# Patient Record
Sex: Female | Born: 1945
Health system: Southern US, Community
[De-identification: ages and names within clinical notes are randomized; demographics above are authoritative.]

## PROBLEM LIST (undated history)

## (undated) DIAGNOSIS — C50919 Malignant neoplasm of unspecified site of unspecified female breast: Secondary | ICD-10-CM

## (undated) DIAGNOSIS — T7840XA Allergy, unspecified, initial encounter: Secondary | ICD-10-CM

## (undated) DIAGNOSIS — Z923 Personal history of irradiation: Secondary | ICD-10-CM

## (undated) HISTORY — PX: BREAST CYST ASPIRATION: SHX578

## (undated) HISTORY — DX: Allergy, unspecified, initial encounter: T78.40XA

---

## 2001-09-12 ENCOUNTER — Other Ambulatory Visit: Admission: RE | Admit: 2001-09-12 | Discharge: 2001-09-12 | Payer: Self-pay | Admitting: Gynecology

## 2002-08-28 ENCOUNTER — Encounter: Payer: Self-pay | Admitting: Family Medicine

## 2002-08-28 ENCOUNTER — Ambulatory Visit (HOSPITAL_COMMUNITY): Admission: RE | Admit: 2002-08-28 | Discharge: 2002-08-28 | Payer: Self-pay | Admitting: Family Medicine

## 2002-10-29 ENCOUNTER — Other Ambulatory Visit: Admission: RE | Admit: 2002-10-29 | Discharge: 2002-10-29 | Payer: Self-pay | Admitting: Gynecology

## 2003-01-22 ENCOUNTER — Encounter: Admission: RE | Admit: 2003-01-22 | Discharge: 2003-01-22 | Payer: Self-pay | Admitting: Family Medicine

## 2003-01-22 ENCOUNTER — Encounter: Payer: Self-pay | Admitting: Family Medicine

## 2003-02-04 ENCOUNTER — Encounter: Admission: RE | Admit: 2003-02-04 | Discharge: 2003-02-04 | Payer: Self-pay | Admitting: Family Medicine

## 2003-02-04 ENCOUNTER — Encounter: Payer: Self-pay | Admitting: Family Medicine

## 2003-11-02 ENCOUNTER — Other Ambulatory Visit: Admission: RE | Admit: 2003-11-02 | Discharge: 2003-11-02 | Payer: Self-pay | Admitting: Gynecology

## 2003-12-17 ENCOUNTER — Ambulatory Visit (HOSPITAL_COMMUNITY): Admission: RE | Admit: 2003-12-17 | Discharge: 2003-12-17 | Payer: Self-pay | Admitting: Family Medicine

## 2004-01-21 ENCOUNTER — Encounter: Admission: RE | Admit: 2004-01-21 | Discharge: 2004-01-21 | Payer: Self-pay | Admitting: Family Medicine

## 2004-11-07 ENCOUNTER — Other Ambulatory Visit: Admission: RE | Admit: 2004-11-07 | Discharge: 2004-11-07 | Payer: Self-pay | Admitting: Gynecology

## 2005-01-26 ENCOUNTER — Encounter: Admission: RE | Admit: 2005-01-26 | Discharge: 2005-01-26 | Payer: Self-pay | Admitting: Family Medicine

## 2005-11-13 ENCOUNTER — Other Ambulatory Visit: Admission: RE | Admit: 2005-11-13 | Discharge: 2005-11-13 | Payer: Self-pay | Admitting: Gynecology

## 2006-01-24 ENCOUNTER — Encounter: Admission: RE | Admit: 2006-01-24 | Discharge: 2006-01-24 | Payer: Self-pay | Admitting: Family Medicine

## 2006-12-05 ENCOUNTER — Other Ambulatory Visit: Admission: RE | Admit: 2006-12-05 | Discharge: 2006-12-05 | Payer: Self-pay | Admitting: Gynecology

## 2007-01-23 ENCOUNTER — Encounter: Admission: RE | Admit: 2007-01-23 | Discharge: 2007-01-23 | Payer: Self-pay | Admitting: Family Medicine

## 2008-01-23 ENCOUNTER — Encounter: Admission: RE | Admit: 2008-01-23 | Discharge: 2008-01-23 | Payer: Self-pay | Admitting: Family Medicine

## 2008-08-25 ENCOUNTER — Encounter: Admission: RE | Admit: 2008-08-25 | Discharge: 2008-08-25 | Payer: Self-pay | Admitting: Family Medicine

## 2009-01-21 ENCOUNTER — Encounter: Admission: RE | Admit: 2009-01-21 | Discharge: 2009-01-21 | Payer: Self-pay | Admitting: Family Medicine

## 2010-01-20 ENCOUNTER — Encounter: Admission: RE | Admit: 2010-01-20 | Discharge: 2010-01-20 | Payer: Self-pay | Admitting: Family Medicine

## 2011-01-03 ENCOUNTER — Other Ambulatory Visit: Payer: Self-pay | Admitting: Family Medicine

## 2011-01-03 DIAGNOSIS — Z1231 Encounter for screening mammogram for malignant neoplasm of breast: Secondary | ICD-10-CM

## 2011-01-12 ENCOUNTER — Ambulatory Visit: Payer: Self-pay

## 2011-02-13 ENCOUNTER — Ambulatory Visit
Admission: RE | Admit: 2011-02-13 | Discharge: 2011-02-13 | Disposition: A | Discharge status: Home / Self Care | Payer: BC Managed Care – PPO | Source: Ambulatory Visit | Attending: Family Medicine | Admitting: Family Medicine

## 2011-02-13 DIAGNOSIS — Z1231 Encounter for screening mammogram for malignant neoplasm of breast: Secondary | ICD-10-CM

## 2011-12-18 ENCOUNTER — Other Ambulatory Visit: Payer: Self-pay | Admitting: Family Medicine

## 2011-12-18 DIAGNOSIS — Z1231 Encounter for screening mammogram for malignant neoplasm of breast: Secondary | ICD-10-CM

## 2012-01-11 ENCOUNTER — Ambulatory Visit
Admission: RE | Admit: 2012-01-11 | Discharge: 2012-01-11 | Disposition: A | Discharge status: Home / Self Care | Payer: BC Managed Care – PPO | Source: Ambulatory Visit | Attending: Family Medicine | Admitting: Family Medicine

## 2012-01-11 DIAGNOSIS — Z1231 Encounter for screening mammogram for malignant neoplasm of breast: Secondary | ICD-10-CM

## 2012-12-25 ENCOUNTER — Other Ambulatory Visit: Payer: Self-pay | Admitting: Family Medicine

## 2012-12-25 DIAGNOSIS — Z1231 Encounter for screening mammogram for malignant neoplasm of breast: Secondary | ICD-10-CM

## 2013-01-07 ENCOUNTER — Ambulatory Visit
Admission: RE | Admit: 2013-01-07 | Discharge: 2013-01-07 | Disposition: A | Discharge status: Home / Self Care | Payer: BC Managed Care – PPO | Source: Ambulatory Visit | Attending: Family Medicine | Admitting: Family Medicine

## 2013-01-07 DIAGNOSIS — Z1231 Encounter for screening mammogram for malignant neoplasm of breast: Secondary | ICD-10-CM

## 2013-12-18 ENCOUNTER — Other Ambulatory Visit: Payer: Self-pay

## 2013-12-18 DIAGNOSIS — Z1231 Encounter for screening mammogram for malignant neoplasm of breast: Secondary | ICD-10-CM

## 2014-01-08 ENCOUNTER — Ambulatory Visit: Payer: BC Managed Care – PPO

## 2014-01-08 ENCOUNTER — Ambulatory Visit
Admission: RE | Admit: 2014-01-08 | Discharge: 2014-01-08 | Disposition: A | Discharge status: Home / Self Care | Payer: BC Managed Care – PPO | Source: Ambulatory Visit

## 2014-01-08 DIAGNOSIS — Z1231 Encounter for screening mammogram for malignant neoplasm of breast: Secondary | ICD-10-CM

## 2015-01-01 ENCOUNTER — Other Ambulatory Visit: Payer: Self-pay

## 2015-01-01 DIAGNOSIS — Z1231 Encounter for screening mammogram for malignant neoplasm of breast: Secondary | ICD-10-CM

## 2015-01-28 ENCOUNTER — Ambulatory Visit
Admission: RE | Admit: 2015-01-28 | Discharge: 2015-01-28 | Disposition: A | Discharge status: Home / Self Care | Payer: Medicare HMO | Source: Ambulatory Visit

## 2015-01-28 DIAGNOSIS — Z1231 Encounter for screening mammogram for malignant neoplasm of breast: Secondary | ICD-10-CM

## 2015-12-28 ENCOUNTER — Other Ambulatory Visit: Payer: Self-pay

## 2015-12-28 DIAGNOSIS — Z1231 Encounter for screening mammogram for malignant neoplasm of breast: Secondary | ICD-10-CM

## 2016-01-31 ENCOUNTER — Ambulatory Visit
Admission: RE | Admit: 2016-01-31 | Discharge: 2016-01-31 | Disposition: A | Discharge status: Home / Self Care | Payer: Medicare HMO | Source: Ambulatory Visit

## 2016-01-31 DIAGNOSIS — Z1231 Encounter for screening mammogram for malignant neoplasm of breast: Secondary | ICD-10-CM

## 2017-01-03 ENCOUNTER — Other Ambulatory Visit: Payer: Self-pay | Admitting: Family Medicine

## 2017-01-03 DIAGNOSIS — Z1231 Encounter for screening mammogram for malignant neoplasm of breast: Secondary | ICD-10-CM

## 2017-02-07 ENCOUNTER — Ambulatory Visit
Admission: RE | Admit: 2017-02-07 | Discharge: 2017-02-07 | Disposition: A | Discharge status: Home / Self Care | Payer: Medicare HMO | Source: Ambulatory Visit | Attending: Family Medicine | Admitting: Family Medicine

## 2017-02-07 DIAGNOSIS — Z1231 Encounter for screening mammogram for malignant neoplasm of breast: Secondary | ICD-10-CM

## 2017-12-31 ENCOUNTER — Other Ambulatory Visit: Payer: Self-pay | Admitting: Family Medicine

## 2017-12-31 DIAGNOSIS — Z1231 Encounter for screening mammogram for malignant neoplasm of breast: Secondary | ICD-10-CM

## 2018-01-18 ENCOUNTER — Emergency Department (HOSPITAL_COMMUNITY): Payer: Medicare HMO

## 2018-01-18 ENCOUNTER — Encounter (HOSPITAL_COMMUNITY): Payer: Self-pay

## 2018-01-18 ENCOUNTER — Emergency Department (HOSPITAL_COMMUNITY)
Admission: EM | Admit: 2018-01-18 | Discharge: 2018-01-18 | Disposition: A | Discharge status: Home / Self Care | Payer: Medicare HMO | Attending: Emergency Medicine | Admitting: Emergency Medicine

## 2018-01-18 DIAGNOSIS — Y939 Activity, unspecified: Secondary | ICD-10-CM | POA: Diagnosis not present

## 2018-01-18 DIAGNOSIS — S0990XA Unspecified injury of head, initial encounter: Secondary | ICD-10-CM | POA: Diagnosis not present

## 2018-01-18 DIAGNOSIS — Y999 Unspecified external cause status: Secondary | ICD-10-CM | POA: Diagnosis not present

## 2018-01-18 DIAGNOSIS — F17210 Nicotine dependence, cigarettes, uncomplicated: Secondary | ICD-10-CM | POA: Insufficient documentation

## 2018-01-18 DIAGNOSIS — M5030 Other cervical disc degeneration, unspecified cervical region: Secondary | ICD-10-CM

## 2018-01-18 DIAGNOSIS — R030 Elevated blood-pressure reading, without diagnosis of hypertension: Secondary | ICD-10-CM

## 2018-01-18 DIAGNOSIS — Y929 Unspecified place or not applicable: Secondary | ICD-10-CM | POA: Diagnosis not present
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
## 2018-01-18 NOTE — ED Notes (Signed)
Patient transported to CT 
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
## 2018-01-18 NOTE — ED Notes (Signed)
Patient discharged by provider as she was in a hurry, did not wait to sign. Patient ambulatory with steady gait.
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
## 2018-02-19 ENCOUNTER — Ambulatory Visit
Admission: RE | Admit: 2018-02-19 | Discharge: 2018-02-19 | Disposition: A | Discharge status: Home / Self Care | Payer: Medicare HMO | Source: Ambulatory Visit | Attending: Family Medicine | Admitting: Family Medicine

## 2018-02-19 DIAGNOSIS — Z1231 Encounter for screening mammogram for malignant neoplasm of breast: Secondary | ICD-10-CM

## 2018-12-09 DIAGNOSIS — J302 Other seasonal allergic rhinitis: Secondary | ICD-10-CM | POA: Diagnosis not present

## 2018-12-09 DIAGNOSIS — Z0000 Encounter for general adult medical examination without abnormal findings: Secondary | ICD-10-CM | POA: Diagnosis not present

## 2018-12-09 DIAGNOSIS — E7800 Pure hypercholesterolemia, unspecified: Secondary | ICD-10-CM | POA: Diagnosis not present

## 2018-12-09 DIAGNOSIS — G4700 Insomnia, unspecified: Secondary | ICD-10-CM | POA: Diagnosis not present

## 2018-12-09 DIAGNOSIS — Z720 Tobacco use: Secondary | ICD-10-CM | POA: Diagnosis not present

## 2018-12-09 DIAGNOSIS — Z1389 Encounter for screening for other disorder: Secondary | ICD-10-CM | POA: Diagnosis not present

## 2018-12-09 DIAGNOSIS — N951 Menopausal and female climacteric states: Secondary | ICD-10-CM | POA: Diagnosis not present

## 2018-12-09 DIAGNOSIS — Z1211 Encounter for screening for malignant neoplasm of colon: Secondary | ICD-10-CM | POA: Diagnosis not present

## 2018-12-26 DIAGNOSIS — E7800 Pure hypercholesterolemia, unspecified: Secondary | ICD-10-CM | POA: Diagnosis not present

## 2019-01-10 ENCOUNTER — Other Ambulatory Visit: Payer: Self-pay | Admitting: Family Medicine

## 2019-01-10 DIAGNOSIS — E875 Hyperkalemia: Secondary | ICD-10-CM | POA: Diagnosis not present

## 2019-01-10 DIAGNOSIS — Z1231 Encounter for screening mammogram for malignant neoplasm of breast: Secondary | ICD-10-CM

## 2019-01-15 DIAGNOSIS — R109 Unspecified abdominal pain: Secondary | ICD-10-CM | POA: Diagnosis not present

## 2019-01-15 DIAGNOSIS — Z1211 Encounter for screening for malignant neoplasm of colon: Secondary | ICD-10-CM | POA: Diagnosis not present

## 2019-01-15 DIAGNOSIS — R350 Frequency of micturition: Secondary | ICD-10-CM | POA: Diagnosis not present

## 2019-01-16 ENCOUNTER — Other Ambulatory Visit: Payer: Self-pay | Admitting: Family Medicine

## 2019-01-16 DIAGNOSIS — R1032 Left lower quadrant pain: Secondary | ICD-10-CM

## 2019-01-20 ENCOUNTER — Other Ambulatory Visit: Payer: Self-pay | Admitting: Family Medicine

## 2019-01-20 DIAGNOSIS — Z1211 Encounter for screening for malignant neoplasm of colon: Secondary | ICD-10-CM

## 2019-01-21 ENCOUNTER — Other Ambulatory Visit: Payer: Self-pay

## 2019-01-21 ENCOUNTER — Ambulatory Visit
Admission: RE | Admit: 2019-01-21 | Discharge: 2019-01-21 | Disposition: A | Discharge status: Home / Self Care | Payer: PPO | Source: Ambulatory Visit | Attending: Family Medicine | Admitting: Family Medicine

## 2019-01-21 DIAGNOSIS — K5730 Diverticulosis of large intestine without perforation or abscess without bleeding: Secondary | ICD-10-CM | POA: Diagnosis not present

## 2019-01-21 DIAGNOSIS — R1032 Left lower quadrant pain: Secondary | ICD-10-CM

## 2019-01-21 MED ORDER — IOPAMIDOL (ISOVUE-300) INJECTION 61%
100.0000 mL | Freq: Once | INTRAVENOUS | Status: AC | PRN
  Administered 2019-01-21: 100 mL via INTRAVENOUS

## 2019-02-06 DIAGNOSIS — Z86010 Personal history of colonic polyps: Secondary | ICD-10-CM | POA: Diagnosis not present

## 2019-02-06 DIAGNOSIS — R933 Abnormal findings on diagnostic imaging of other parts of digestive tract: Secondary | ICD-10-CM | POA: Diagnosis not present

## 2019-02-27 ENCOUNTER — Ambulatory Visit
Admission: RE | Admit: 2019-02-27 | Discharge: 2019-02-27 | Disposition: A | Discharge status: Home / Self Care | Payer: Medicare HMO | Source: Ambulatory Visit | Attending: Family Medicine | Admitting: Family Medicine

## 2019-02-27 ENCOUNTER — Other Ambulatory Visit: Payer: Self-pay

## 2019-02-27 DIAGNOSIS — Z1231 Encounter for screening mammogram for malignant neoplasm of breast: Secondary | ICD-10-CM

## 2019-03-13 DIAGNOSIS — K5730 Diverticulosis of large intestine without perforation or abscess without bleeding: Secondary | ICD-10-CM | POA: Diagnosis not present

## 2019-03-13 DIAGNOSIS — D122 Benign neoplasm of ascending colon: Secondary | ICD-10-CM | POA: Diagnosis not present

## 2019-03-13 DIAGNOSIS — D123 Benign neoplasm of transverse colon: Secondary | ICD-10-CM | POA: Diagnosis not present

## 2019-03-13 DIAGNOSIS — K5289 Other specified noninfective gastroenteritis and colitis: Secondary | ICD-10-CM | POA: Diagnosis not present

## 2019-03-13 DIAGNOSIS — Z86010 Personal history of colonic polyps: Secondary | ICD-10-CM | POA: Diagnosis not present

## 2019-03-18 DIAGNOSIS — D122 Benign neoplasm of ascending colon: Secondary | ICD-10-CM | POA: Diagnosis not present

## 2019-03-18 DIAGNOSIS — D123 Benign neoplasm of transverse colon: Secondary | ICD-10-CM | POA: Diagnosis not present

## 2019-03-18 DIAGNOSIS — K5289 Other specified noninfective gastroenteritis and colitis: Secondary | ICD-10-CM | POA: Diagnosis not present

## 2019-04-02 DIAGNOSIS — Z01419 Encounter for gynecological examination (general) (routine) without abnormal findings: Secondary | ICD-10-CM | POA: Diagnosis not present

## 2019-04-02 DIAGNOSIS — Z780 Asymptomatic menopausal state: Secondary | ICD-10-CM | POA: Diagnosis not present

## 2019-04-02 DIAGNOSIS — Z681 Body mass index (BMI) 19 or less, adult: Secondary | ICD-10-CM | POA: Diagnosis not present

## 2019-04-02 DIAGNOSIS — Z130 Encounter for screening for diseases of the blood and blood-forming organs and certain disorders involving the immune mechanism: Secondary | ICD-10-CM | POA: Diagnosis not present

## 2019-04-02 DIAGNOSIS — Z1389 Encounter for screening for other disorder: Secondary | ICD-10-CM | POA: Diagnosis not present

## 2019-04-02 DIAGNOSIS — Z79890 Hormone replacement therapy (postmenopausal): Secondary | ICD-10-CM | POA: Diagnosis not present

## 2019-09-16 DIAGNOSIS — K5792 Diverticulitis of intestine, part unspecified, without perforation or abscess without bleeding: Secondary | ICD-10-CM | POA: Diagnosis not present

## 2020-01-23 ENCOUNTER — Other Ambulatory Visit: Payer: Self-pay | Admitting: Family Medicine

## 2020-01-23 DIAGNOSIS — Z1231 Encounter for screening mammogram for malignant neoplasm of breast: Secondary | ICD-10-CM

## 2020-03-01 ENCOUNTER — Ambulatory Visit: Payer: PPO

## 2020-03-09 ENCOUNTER — Other Ambulatory Visit: Payer: Self-pay

## 2020-03-09 ENCOUNTER — Ambulatory Visit
Admission: RE | Admit: 2020-03-09 | Discharge: 2020-03-09 | Disposition: A | Discharge status: Home / Self Care | Payer: PPO | Source: Ambulatory Visit | Attending: Family Medicine | Admitting: Family Medicine

## 2020-03-09 DIAGNOSIS — Z1231 Encounter for screening mammogram for malignant neoplasm of breast: Secondary | ICD-10-CM

## 2020-04-07 DIAGNOSIS — Z780 Asymptomatic menopausal state: Secondary | ICD-10-CM | POA: Diagnosis not present

## 2020-04-07 DIAGNOSIS — Z681 Body mass index (BMI) 19 or less, adult: Secondary | ICD-10-CM | POA: Diagnosis not present

## 2020-04-07 DIAGNOSIS — Z79890 Hormone replacement therapy (postmenopausal): Secondary | ICD-10-CM | POA: Diagnosis not present

## 2020-04-07 DIAGNOSIS — Z01419 Encounter for gynecological examination (general) (routine) without abnormal findings: Secondary | ICD-10-CM | POA: Diagnosis not present

## 2020-04-16 DIAGNOSIS — E7800 Pure hypercholesterolemia, unspecified: Secondary | ICD-10-CM | POA: Diagnosis not present

## 2020-04-16 DIAGNOSIS — J302 Other seasonal allergic rhinitis: Secondary | ICD-10-CM | POA: Diagnosis not present

## 2020-04-16 DIAGNOSIS — N951 Menopausal and female climacteric states: Secondary | ICD-10-CM | POA: Diagnosis not present

## 2020-04-16 DIAGNOSIS — Z1389 Encounter for screening for other disorder: Secondary | ICD-10-CM | POA: Diagnosis not present

## 2020-04-16 DIAGNOSIS — Z0000 Encounter for general adult medical examination without abnormal findings: Secondary | ICD-10-CM | POA: Diagnosis not present

## 2020-04-16 DIAGNOSIS — Z720 Tobacco use: Secondary | ICD-10-CM | POA: Diagnosis not present

## 2020-04-16 DIAGNOSIS — Z122 Encounter for screening for malignant neoplasm of respiratory organs: Secondary | ICD-10-CM | POA: Diagnosis not present

## 2020-04-16 DIAGNOSIS — G4700 Insomnia, unspecified: Secondary | ICD-10-CM | POA: Diagnosis not present

## 2020-09-03 DIAGNOSIS — H52223 Regular astigmatism, bilateral: Secondary | ICD-10-CM | POA: Diagnosis not present

## 2020-09-03 DIAGNOSIS — H04123 Dry eye syndrome of bilateral lacrimal glands: Secondary | ICD-10-CM | POA: Diagnosis not present

## 2020-09-03 DIAGNOSIS — H524 Presbyopia: Secondary | ICD-10-CM | POA: Diagnosis not present

## 2020-09-03 DIAGNOSIS — H5203 Hypermetropia, bilateral: Secondary | ICD-10-CM | POA: Diagnosis not present

## 2020-09-03 DIAGNOSIS — H43812 Vitreous degeneration, left eye: Secondary | ICD-10-CM | POA: Diagnosis not present

## 2020-09-03 DIAGNOSIS — H11153 Pinguecula, bilateral: Secondary | ICD-10-CM | POA: Diagnosis not present

## 2020-09-03 DIAGNOSIS — H2511 Age-related nuclear cataract, right eye: Secondary | ICD-10-CM | POA: Diagnosis not present

## 2020-09-16 ENCOUNTER — Other Ambulatory Visit: Payer: Self-pay | Admitting: *Deleted

## 2020-09-16 DIAGNOSIS — Z87891 Personal history of nicotine dependence: Secondary | ICD-10-CM

## 2020-09-16 DIAGNOSIS — F17210 Nicotine dependence, cigarettes, uncomplicated: Secondary | ICD-10-CM

## 2020-10-25 ENCOUNTER — Ambulatory Visit (INDEPENDENT_AMBULATORY_CARE_PROVIDER_SITE_OTHER)
Admission: RE | Admit: 2020-10-25 | Discharge: 2020-10-25 | Disposition: A | Discharge status: Home / Self Care | Payer: PPO | Source: Ambulatory Visit | Attending: Cardiology | Admitting: Cardiology

## 2020-10-25 ENCOUNTER — Ambulatory Visit (INDEPENDENT_AMBULATORY_CARE_PROVIDER_SITE_OTHER): Payer: PPO | Admitting: Acute Care

## 2020-10-25 ENCOUNTER — Other Ambulatory Visit: Payer: Self-pay

## 2020-10-25 ENCOUNTER — Encounter: Payer: Self-pay | Admitting: Acute Care

## 2020-10-25 VITALS — BP 112/74 | HR 70 | Temp 97.9°F | Ht 64.0 in | Wt 109.4 lb

## 2020-10-25 DIAGNOSIS — F17210 Nicotine dependence, cigarettes, uncomplicated: Secondary | ICD-10-CM

## 2020-10-25 DIAGNOSIS — Z122 Encounter for screening for malignant neoplasm of respiratory organs: Secondary | ICD-10-CM

## 2020-10-25 DIAGNOSIS — Z87891 Personal history of nicotine dependence: Secondary | ICD-10-CM

## 2020-10-25 NOTE — Patient Instructions (Signed)
Thank you for participating in the [place] [facility] Cancer Screening Program. It was our pleasure to meet you today. We will call you with the results of your scan within the next few days. Your scan will be assigned a Lung RADS category score by the physicians reading the scans.  This Lung RADS score determines follow up scanning.  See below for description of categories, and follow up screening recommendations. We will be in touch to schedule your follow up screening annually or based on recommendations of our providers. We will fax a copy of your scan results to your Primary Care Physician, or the physician who referred you to the program, to ensure they have the results. Please call the office if you have any questions or concerns regarding your scanning experience or results.  Our office number is [phone]. Please speak with Denise Phelps, RN. She is our Lung Cancer Screening RN. If she is unavailable when you call, please have the office staff send her a message. She will return your call at her earliest convenience. Remember, if your scan is normal, we will scan you annually as long as you continue to meet the criteria for the program. (Age 55-77, Current smoker or smoker who has quit within the last 15 years). If you are a smoker, remember, quitting is the single most powerful action that you can take to decrease your risk of lung cancer and other pulmonary, breathing related problems. We know quitting is hard, and we are here to help.  Please let us know if there is anything we can do to help you meet your goal of quitting. If you are a former smoker, congratulations. We are proud of you! Remain smoke free! Remember you can refer friends or family members through the number above.  We will screen them to make sure they meet criteria for the program. Thank you for helping us take better care of you by participating in Lung Screening.  Lung RADS Categories:  Lung RADS 1: no nodules  or definitely non-concerning nodules.  Recommendation is for a repeat annual scan in 12 months.  Lung RADS 2:  nodules that are non-concerning in appearance and behavior with a very low likelihood of becoming an active cancer. Recommendation is for a repeat annual scan in 12 months.  Lung RADS 3: nodules that are probably non-concerning , includes nodules with a low likelihood of becoming an active cancer.  Recommendation is for a 6-month repeat screening scan. Often noted after an upper respiratory illness. We will be in touch to make sure you have no questions, and to schedule your 6-month scan.  Lung RADS 4 A: nodules with concerning findings, recommendation is most often for a follow up scan in 3 months or additional testing based on our provider's assessment of the scan. We will be in touch to make sure you have no questions and to schedule the recommended 3 month follow up scan.  Lung RADS 4 B:  indicates findings that are concerning. We will be in touch with you to schedule additional diagnostic testing based on our provider's  assessment of the scan.   

## 2020-10-25 NOTE — Progress Notes (Signed)
Shared Decision Making Visit Lung Cancer Screening Program [phone])   Eligibility:  Age 75 y.o.  Pack Years Smoking History Calculation 25 pack year smoking history (# packs/per year x # years smoked)  Recent History of coughing up blood  no  Unexplained weight loss? no ( >Than 15 pounds within the last 6 months )  Prior History Lung / other cancer no (Diagnosis within the last 5 years already requiring surveillance chest CT Scans).  Smoking Status Current Smoker  Former Smokers: Years since quit: NA  Quit Date: NA  Visit Components:  Discussion included one or more decision making aids. yes  Discussion included risk/benefits of screening. yes  Discussion included potential follow up diagnostic testing for abnormal scans. yes  Discussion included meaning and risk of over diagnosis. yes  Discussion included meaning and risk of False Positives. yes  Discussion included meaning of total radiation exposure. yes  Counseling Included:  Importance of adherence to annual lung cancer LDCT screening. yes  Impact of comorbidities on ability to participate in the program. yes  Ability and willingness to under diagnostic treatment. yes  Smoking Cessation Counseling:  Current Smokers:   Discussed importance of smoking cessation. yes  Information about tobacco cessation classes and interventions provided to patient. yes  Patient provided with "ticket" for LDCT Scan. yes  Symptomatic Patient. no  Counseling NA  Diagnosis Code: Tobacco Use Z72.0  Asymptomatic Patient yes  Counseling (Intermediate counseling: > three minutes counseling) E9937  Former Smokers:   Discussed the importance of maintaining cigarette abstinence. yes  Diagnosis Code: Personal History of Nicotine Dependence. J69.678  Information about tobacco cessation classes and interventions provided to patient. Yes  Patient provided with "ticket" for LDCT Scan. yes  Written Order for Lung Cancer  Screening with LDCT placed in Epic. Yes (CT Chest Lung Cancer Screening Low Dose W/O CM) [id number] Z12.2-Screening of respiratory organs Z87.891-Personal history of nicotine dependence  BP 112/74 (BP Location: Right Arm, Cuff Size: Normal)   Pulse 70   SpO2 97%    I have spent 25 minutes of face to face time with Jillian Tyler discussing the risks and benefits of lung cancer screening. We viewed a power point together that explained in detail the above noted topics. We paused at intervals to allow for questions to be asked and answered to ensure understanding.We discussed that the single most powerful action that she can take to decrease her risk of developing lung cancer is to quit smoking. We discussed whether or not she is ready to commit to setting a quit date. We discussed options for tools to aid in quitting smoking including nicotine replacement therapy, non-nicotine medications, support groups, Quit Smart classes, and behavior modification. We discussed that often times setting smaller, more achievable goals, such as eliminating 1 cigarette a day for a week and then 2 cigarettes a day for a week can be helpful in slowly decreasing the number of cigarettes smoked. This allows for a sense of accomplishment as well as providing a clinical benefit. I gave her the " Be Stronger Than Your Excuses" card with contact information for community resources, classes, free nicotine replacement therapy, and access to mobile apps, text messaging, and on-line smoking cessation help. I have also given her my card and contact information in the event she needs to contact me. We discussed the time and location of the scan, and that either Doroteo Glassman RN or I will call with the results within 24-48 hours of receiving them. I have offered  her  a copy of the power point we viewed  as a resource in the event they need reinforcement of the concepts we discussed today in the office. The patient verbalized understanding of all  of  the above and had no further questions upon leaving the office. They have my contact information in the event they have any further questions.  I spent 3 minutes counseling on smoking cessation and the health risks of continued tobacco abuse.  I explained to the patient that there has been a high incidence of coronary artery disease noted on these exams. I explained that this is a non-gated exam therefore degree or severity cannot be determined. This patient is not currently on statin therapy. I have asked the patient to follow-up with their PCP regarding any incidental finding of coronary artery disease and management with diet or medication as their PCP  feels is clinically indicated. The patient verbalized understanding of the above and had no further questions upon completion of the visit.      Magdalen Spatz, NP 10/25/2020

## 2020-11-11 ENCOUNTER — Other Ambulatory Visit: Payer: Self-pay | Admitting: *Deleted

## 2020-11-11 DIAGNOSIS — J9819 Other pulmonary collapse: Secondary | ICD-10-CM

## 2020-11-11 NOTE — Progress Notes (Signed)
Denise, I have called the patient and explained that her scan was read as a LR 0>> , incomplete. Scan  Right middle lobe cannot be assessed due to complete right middle lobe collapse. She states she was sick earlier I March, but feels better now.  Can we please get her scheduled to see me next week if I have an opening? The week after if I do not have openings. She will need a stat CXR prior to the visit, ( She needs to come in 15 minutes early to get it so we can discuss the results) and then treat her if we need to. She will need a 3 month follow up scan after I see her. Thanks so much.

## 2020-11-22 ENCOUNTER — Ambulatory Visit: Payer: PPO | Admitting: Acute Care

## 2020-12-15 ENCOUNTER — Ambulatory Visit: Payer: PPO | Admitting: Acute Care

## 2021-02-16 ENCOUNTER — Other Ambulatory Visit: Payer: Self-pay | Admitting: Family Medicine

## 2021-02-16 DIAGNOSIS — Z1231 Encounter for screening mammogram for malignant neoplasm of breast: Secondary | ICD-10-CM

## 2021-02-23 ENCOUNTER — Ambulatory Visit: Payer: PPO | Admitting: Acute Care

## 2021-04-11 ENCOUNTER — Ambulatory Visit: Payer: PPO | Admitting: Acute Care

## 2021-04-13 DIAGNOSIS — Z79890 Hormone replacement therapy (postmenopausal): Secondary | ICD-10-CM | POA: Diagnosis not present

## 2021-04-13 DIAGNOSIS — Z01419 Encounter for gynecological examination (general) (routine) without abnormal findings: Secondary | ICD-10-CM | POA: Diagnosis not present

## 2021-04-13 DIAGNOSIS — Z780 Asymptomatic menopausal state: Secondary | ICD-10-CM | POA: Diagnosis not present

## 2021-04-18 ENCOUNTER — Ambulatory Visit: Payer: PPO | Admitting: Acute Care

## 2021-04-25 ENCOUNTER — Ambulatory Visit
Admission: RE | Admit: 2021-04-25 | Discharge: 2021-04-25 | Disposition: A | Discharge status: Home / Self Care | Payer: PPO | Source: Ambulatory Visit | Attending: Family Medicine | Admitting: Family Medicine

## 2021-04-25 ENCOUNTER — Other Ambulatory Visit: Payer: Self-pay

## 2021-04-25 DIAGNOSIS — Z1231 Encounter for screening mammogram for malignant neoplasm of breast: Secondary | ICD-10-CM

## 2021-04-29 ENCOUNTER — Other Ambulatory Visit: Payer: Self-pay | Admitting: Family Medicine

## 2021-04-29 DIAGNOSIS — R928 Other abnormal and inconclusive findings on diagnostic imaging of breast: Secondary | ICD-10-CM

## 2021-05-03 DIAGNOSIS — N951 Menopausal and female climacteric states: Secondary | ICD-10-CM | POA: Diagnosis not present

## 2021-05-03 DIAGNOSIS — E7800 Pure hypercholesterolemia, unspecified: Secondary | ICD-10-CM | POA: Diagnosis not present

## 2021-05-03 DIAGNOSIS — J439 Emphysema, unspecified: Secondary | ICD-10-CM | POA: Diagnosis not present

## 2021-05-03 DIAGNOSIS — Z122 Encounter for screening for malignant neoplasm of respiratory organs: Secondary | ICD-10-CM | POA: Diagnosis not present

## 2021-05-03 DIAGNOSIS — Z1389 Encounter for screening for other disorder: Secondary | ICD-10-CM | POA: Diagnosis not present

## 2021-05-03 DIAGNOSIS — J302 Other seasonal allergic rhinitis: Secondary | ICD-10-CM | POA: Diagnosis not present

## 2021-05-03 DIAGNOSIS — G4700 Insomnia, unspecified: Secondary | ICD-10-CM | POA: Diagnosis not present

## 2021-05-03 DIAGNOSIS — Z0000 Encounter for general adult medical examination without abnormal findings: Secondary | ICD-10-CM | POA: Diagnosis not present

## 2021-05-04 ENCOUNTER — Other Ambulatory Visit: Payer: Self-pay | Admitting: Family Medicine

## 2021-05-04 DIAGNOSIS — R9389 Abnormal findings on diagnostic imaging of other specified body structures: Secondary | ICD-10-CM

## 2021-05-04 DIAGNOSIS — J439 Emphysema, unspecified: Secondary | ICD-10-CM

## 2021-05-07 DIAGNOSIS — J0100 Acute maxillary sinusitis, unspecified: Secondary | ICD-10-CM | POA: Diagnosis not present

## 2021-05-07 DIAGNOSIS — F17200 Nicotine dependence, unspecified, uncomplicated: Secondary | ICD-10-CM | POA: Diagnosis not present

## 2021-05-10 ENCOUNTER — Ambulatory Visit
Admission: RE | Admit: 2021-05-10 | Discharge: 2021-05-10 | Disposition: A | Discharge status: Home / Self Care | Payer: PPO | Source: Ambulatory Visit | Attending: Family Medicine | Admitting: Family Medicine

## 2021-05-10 ENCOUNTER — Other Ambulatory Visit: Payer: Self-pay

## 2021-05-10 DIAGNOSIS — J9811 Atelectasis: Secondary | ICD-10-CM | POA: Diagnosis not present

## 2021-05-10 DIAGNOSIS — R9389 Abnormal findings on diagnostic imaging of other specified body structures: Secondary | ICD-10-CM

## 2021-05-10 DIAGNOSIS — I700 Atherosclerosis of aorta: Secondary | ICD-10-CM | POA: Diagnosis not present

## 2021-05-10 DIAGNOSIS — J439 Emphysema, unspecified: Secondary | ICD-10-CM | POA: Diagnosis not present

## 2021-05-10 DIAGNOSIS — I2510 Atherosclerotic heart disease of native coronary artery without angina pectoris: Secondary | ICD-10-CM | POA: Diagnosis not present

## 2021-05-10 MED ORDER — IOPAMIDOL (ISOVUE-300) INJECTION 61%
75.0000 mL | Freq: Once | INTRAVENOUS | Status: AC | PRN
  Administered 2021-05-10: 75 mL via INTRAVENOUS

## 2021-05-13 ENCOUNTER — Other Ambulatory Visit: Payer: Self-pay

## 2021-05-13 ENCOUNTER — Other Ambulatory Visit: Payer: Self-pay | Admitting: Family Medicine

## 2021-05-13 ENCOUNTER — Ambulatory Visit
Admission: RE | Admit: 2021-05-13 | Discharge: 2021-05-13 | Disposition: A | Discharge status: Home / Self Care | Payer: PPO | Source: Ambulatory Visit | Attending: Family Medicine | Admitting: Family Medicine

## 2021-05-13 DIAGNOSIS — R928 Other abnormal and inconclusive findings on diagnostic imaging of breast: Secondary | ICD-10-CM

## 2021-05-13 DIAGNOSIS — R922 Inconclusive mammogram: Secondary | ICD-10-CM | POA: Diagnosis not present

## 2021-05-18 ENCOUNTER — Telehealth: Payer: Self-pay | Admitting: Internal Medicine

## 2021-05-18 NOTE — Telephone Encounter (Signed)
Patient scheduled 05/31/21 with Dr. Shearon Stalls for COPD consult.  Papers received from [facility] via fax contain labs, including cbcb with diff collected 05/03/21.

## 2021-05-23 ENCOUNTER — Other Ambulatory Visit: Payer: Self-pay

## 2021-05-23 ENCOUNTER — Ambulatory Visit
Admission: RE | Admit: 2021-05-23 | Discharge: 2021-05-23 | Disposition: A | Discharge status: Home / Self Care | Payer: PPO | Source: Ambulatory Visit | Attending: Family Medicine | Admitting: Family Medicine

## 2021-05-23 DIAGNOSIS — Z170 Estrogen receptor positive status [ER+]: Secondary | ICD-10-CM | POA: Diagnosis not present

## 2021-05-23 DIAGNOSIS — C50212 Malignant neoplasm of upper-inner quadrant of left female breast: Secondary | ICD-10-CM | POA: Diagnosis not present

## 2021-05-23 DIAGNOSIS — N6322 Unspecified lump in the left breast, upper inner quadrant: Secondary | ICD-10-CM | POA: Diagnosis not present

## 2021-05-23 DIAGNOSIS — R928 Other abnormal and inconclusive findings on diagnostic imaging of breast: Secondary | ICD-10-CM

## 2021-05-23 HISTORY — PX: BREAST BIOPSY: SHX20

## 2021-05-31 ENCOUNTER — Other Ambulatory Visit: Payer: Self-pay

## 2021-05-31 ENCOUNTER — Ambulatory Visit: Payer: PPO | Admitting: Internal Medicine

## 2021-05-31 ENCOUNTER — Encounter: Payer: Self-pay | Admitting: Internal Medicine

## 2021-05-31 VITALS — BP 120/64 | HR 64 | Temp 98.0°F | Ht 64.0 in | Wt 105.4 lb

## 2021-05-31 DIAGNOSIS — F17200 Nicotine dependence, unspecified, uncomplicated: Secondary | ICD-10-CM

## 2021-05-31 DIAGNOSIS — R9389 Abnormal findings on diagnostic imaging of other specified body structures: Secondary | ICD-10-CM | POA: Diagnosis not present

## 2021-05-31 DIAGNOSIS — J432 Centrilobular emphysema: Secondary | ICD-10-CM | POA: Diagnosis not present

## 2021-05-31 NOTE — Progress Notes (Signed)
Jillian Tyler    [id number]    28-Aug-1945  Primary Care Physician:Swayne, Shanon Brow, MD  Referring Physician: Antony Contras, MD [address] [place],  [place] [address] Reason for Consultation: abnormal CT Chest Date of Consultation: 05/31/2021  Chief complaint:   Chief Complaint  Patient presents with   Consult    Pt had a CT performed 10/11. States that she does currently have SOB due to having a sinus infection but other than that, usually states she is okay. Also has a current cough due to her sinus infection.     HPI: Jillian Tyler is a 75 y.o. woman with history of tobacco use disorder who presents for abnormal CT scan.   She gets short of breath doing laundry and going up and down stairs.  She denies any dyspnea that limits her daily activities. She takes care of her grandchildren and is able to keep up. She can do what she wants to do, just does it slowly. Denies otherwise daily cough, chest pressure, wheezing.   She has had pneumonia and bronchitis but none recently. Has never been hospitalized for her breathing.  Had LDCT scan done for lung cancer screening. Showed RML collapse in March 2022. Repeat scan done a couple weeks ago. When she had the CT scan done, the contrast infiltrated and her arm and was very red and painful for a week. Persistent RML collapse.   Also Recently had a mammogram with abnormal results. Had a biopsy and she is diagnosed with cancer. She is awaiting follow up with a surgeon.  Seeing Dr. Donne Hazel on Friday for surgery evaluation.   Recently had a z pack for sinus congestion still having symptoms. Says symptoms often start as nasal congestion and then progression to her chest. Currently sinus issues have improved, has lingering cough.    Social history:  Occupation: worked as an Cabin crew  Exposures: lives at home with husband, Neurosurgeon, grandchildren  Smoking history: 0.25 ppd x 40 years  Social History    Occupational History   Not on file  Tobacco Use   Smoking status: Every Day    Packs/day: 0.25    Years: 40.00    Pack years: 10.00    Types: Cigarettes    Start date: 1972   Smokeless tobacco: Never   Tobacco comments:    2 cigs daily-ep 05/31/21  Substance and Sexual Activity   Alcohol use: Never   Drug use: Never   Sexual activity: Not on file    Relevant family history:  Family History  Problem Relation Age of Onset   Stroke Mother    Prostate cancer Father    Kidney cancer Brother    Lung disease Neg Hx     History reviewed. No pertinent past medical history.  Past Surgical History:  Procedure Laterality Date   BREAST CYST ASPIRATION Right    20 + yrs ago     Physical Exam: Blood pressure 120/64, pulse 64, temperature 98 F (36.7 C), temperature source Oral, height 5\' 4"  (1.626 m), weight 105 lb 6.4 oz (47.8 kg), SpO2 98 %. Gen:      No acute distress, thin ENT:  no nasal polyps, mucus membranes moist, mallampati IV Lungs:    Diminished, No increased respiratory effort, symmetric chest wall excursion, clear to auscultation bilaterally, no wheezes or crackles CV:         Regular rate and rhythm; no murmurs, rubs, or  gallops.  No pedal edema Abd:      + bowel sounds; soft, non-tender; no distension MSK: no acute synovitis of DIP or PIP joints, no mechanics hands.  Skin:      Warm and dry; no rashes Neuro: normal speech, no focal facial asymmetry Psych: alert and oriented x3, normal mood and affect   Data Reviewed/Medical Decision Making:  Independent interpretation of tests: Imaging:  Review of patient's CT scan 05/10/21 images revealed severe centrilobular emphysema, RML collapse with possible endobronchial lesion. This is unchanged when compared to march The patient's images have been independently reviewed by me.    PFTs: None on file No flowsheet data found.  Labs:    Immunization status:  Immunization History  Administered Date(s)  Administered   [employer] Vaccination 09/12/2019, 10/10/2019   Pneumococcal Conjugate-13 11/30/2016   Pneumococcal Polysaccharide-23 12/03/2017     I reviewed prior external note(s) from PCP, ED visit  I reviewed the result(s) of the labs and imaging as noted above.   I have ordered PFT, bronchoscopy   Discussion of management or test interpretation with another colleague.   Assessment:  Centrilobular Emphysema Abnormal CT Chest - RML persistent collapse concerning for endobronchial lesions New diagnosis Breast Cancer Cough  Plan/Recommendations: RML persistent collapse needs evaluation by bronchoscopy. We discussed risks and benefits of procedure. She is agreeable to proceed. She would like to wait to schedule on Monday once she hears back from surgeons regarding her breast cancer. This is reasonable.  Will obtain a full set of PFTs to further evaluate her dyspnea and emphysema.  Will monitor off inhaler therapy for now.  Her sinus issues have resolved. She is not wheezing or in a copd exacerbation. Recommend otc mucolytics to help with cough as she recovers from viral infection. No further abx needed - she recently had azithromycin.    We discussed disease management and progression at length today.   Return to Care: Return in about 4 weeks (around 06/28/2021).  Lenice Llamas, MD [facility] [place]  CC: Antony Contras, MD

## 2021-05-31 NOTE — Patient Instructions (Addendum)
Please schedule follow up scheduled with myself in 4 weeks.   The next time you come we'll have you come early to do some breathing testing.  Full PFT  - 1 hr  We will call you Monday to schedule your bronchoscopy to take a closer look at the abnormality in your lungs.   Try some mucinex over the counter for your congestion - this will help break up the cough.

## 2021-06-03 DIAGNOSIS — C50212 Malignant neoplasm of upper-inner quadrant of left female breast: Secondary | ICD-10-CM | POA: Diagnosis not present

## 2021-06-03 DIAGNOSIS — Z170 Estrogen receptor positive status [ER+]: Secondary | ICD-10-CM | POA: Diagnosis not present

## 2021-06-06 ENCOUNTER — Telehealth: Payer: Self-pay | Admitting: Radiation Oncology

## 2021-06-06 NOTE — Progress Notes (Signed)
New Breast Cancer Diagnosis: Left Breast UIQ  Did patient present with symptoms (if so, please note symptoms) or screening mammography?:Screening Mass   Location and Extent of disease :left breast. Located at 10 o'clock position, measured  1.1 cm in greatest dimension. Adenopathy no.  Histology per Pathology Report: grade 1-2, Invasive Lobular Carcinoma 05/23/2021  Receptor Status: ER(positive), PR (positive), Her2-neu (negative), Ki-(5%)   Surgeon and surgical plan, if any:  Dr. Donne Hazel 06/03/2021 - I think it would be reasonable to omit a sentinel node biopsy. -She has a work-up for a pulmonary nodule pending but I think this will take precedence and I will send her pulmonologist a message. -Lumpectomy 06/27/2021   Medical oncologist, treatment if any:   Dr. Burr Medico 06/08/2021  Family History of Breast/Ovarian/Prostate Cancer: Father had prostate cancer.  She thinks her sister had some sort of Ovarian, but she is not 100% sure.  Lymphedema issues, if any: no     Pain issues, if any: No  SAFETY ISSUES: Prior radiation? No Pacemaker/ICD? No Possible current pregnancy? Postmenopausal Is the patient on methotrexate? No  Current Complaints / other details:

## 2021-06-07 ENCOUNTER — Encounter: Payer: Self-pay | Admitting: Radiation Oncology

## 2021-06-07 ENCOUNTER — Telehealth: Payer: Self-pay | Admitting: Hematology

## 2021-06-07 ENCOUNTER — Other Ambulatory Visit: Payer: Self-pay

## 2021-06-07 ENCOUNTER — Ambulatory Visit
Admission: RE | Admit: 2021-06-07 | Discharge: 2021-06-07 | Disposition: A | Discharge status: Home / Self Care | Payer: PPO | Source: Ambulatory Visit | Attending: Radiation Oncology | Admitting: Radiation Oncology

## 2021-06-07 VITALS — Ht 64.0 in | Wt 109.0 lb

## 2021-06-07 DIAGNOSIS — C50212 Malignant neoplasm of upper-inner quadrant of left female breast: Secondary | ICD-10-CM

## 2021-06-07 DIAGNOSIS — Z170 Estrogen receptor positive status [ER+]: Secondary | ICD-10-CM

## 2021-06-07 HISTORY — DX: Malignant neoplasm of unspecified site of unspecified female breast: C50.919

## 2021-06-07 NOTE — Telephone Encounter (Signed)
Scheduled appt per 11/4 referral. Pt is aware of appt date and time.  

## 2021-06-08 ENCOUNTER — Inpatient Hospital Stay: Payer: PPO | Attending: Hematology | Admitting: Hematology

## 2021-06-08 ENCOUNTER — Encounter: Payer: Self-pay | Admitting: Hematology

## 2021-06-08 DIAGNOSIS — F17210 Nicotine dependence, cigarettes, uncomplicated: Secondary | ICD-10-CM | POA: Insufficient documentation

## 2021-06-08 DIAGNOSIS — Z170 Estrogen receptor positive status [ER+]: Secondary | ICD-10-CM | POA: Diagnosis not present

## 2021-06-08 DIAGNOSIS — Z8051 Family history of malignant neoplasm of kidney: Secondary | ICD-10-CM | POA: Insufficient documentation

## 2021-06-08 DIAGNOSIS — Z8042 Family history of malignant neoplasm of prostate: Secondary | ICD-10-CM | POA: Diagnosis not present

## 2021-06-08 DIAGNOSIS — C50212 Malignant neoplasm of upper-inner quadrant of left female breast: Secondary | ICD-10-CM | POA: Diagnosis not present

## 2021-06-08 NOTE — Progress Notes (Signed)
Jillian Tyler   [phone] Fax:([phone]) [place] Note   Patient Care Team: Antony Contras, MD as PCP - General (Family Medicine) Rolm Bookbinder, MD as Consulting Physician (General Surgery) Kyung Rudd, MD as Consulting Physician (Radiation Oncology) Truitt Merle, MD as Consulting Physician (Hematology)  Date of Service:  06/08/2021   CHIEF COMPLAINTS/PURPOSE OF CONSULTATION:  Left Breast Cancer, ER+  REFERRING PHYSICIAN:  Dr. Donne Hazel   ASSESSMENT & PLAN:  Jillian Tyler is a 75 y.o. postmenopausal female with no significant medical history  1. Malignant neoplasm of upper-inner quadrant of left breast, invasive lobular carcinoma, stage IA, c(T1c, N0), ER+/PR+/HER2-, Grade 1-2 -found on screening mammogram. Left diagnostic MM/US on 05/13/21 showed 1.1 cm mass at 10 o'clock. Biopsy 05/23/21 showed invasive and in situ lobular carcinoma, grade 1-2. --We discussed her imaging findings and the biopsy results in great details. -She is scheduled for left lumpectomy on 06/27/21 with Dr. Donne Hazel. -I recommend a Oncotype Dx test on the surgical sample and we'll make a decision about adjuvant chemotherapy based on the Oncotype result. Written material of this test was given to her. She is 75 yo but in good health overall, and fit, would be a candidate for chemotherapy if her Oncotype recurrence score is high, but my threshold for chemotherapy will be high due to her advanced age.  Given the lobular histology and strongly ER/PR positivity, I anticipated this is low risk disease. -Giving the strong ER and PR expression in her postmenopausal status, I recommend adjuvant endocrine therapy with aromatase inhibitor for a total of 5-10 years to reduce the risk of cancer recurrence. Potential benefits and side effects were discussed with patient and she is interested. -She was also seen by radiation oncologist Dr. Lisbeth Renshaw yesterday, 06/07/21. She will likely  benefit from breast radiation if she undergo lumpectomy to decrease the risk of breast cancer. -We also discussed the breast cancer surveillance after her surgery. She will continue annual screening mammogram, self exam, and a routine office visit with lab and exam with [place]. -I encouraged her to have healthy diet and exercise regularly.   2. Bone Health  -Her most recent DEXA was normal, per pt.   PLAN:  -proceed with lumpectomy 06/27/21 with Dr. Donne Hazel -Oncotype on her surgical sample, if tumor>1cm  -I will see her back at the end of her radiation or a month after surgery if no RT    Oncology History Overview Note  Cancer Staging Malignant neoplasm of upper-inner quadrant of left breast in female, estrogen receptor positive ([place]) Staging form: Breast, AJCC 8th Edition - Clinical stage from 05/23/2021: Stage IA (cT1c, cN0, cM0, G1, ER+, PR+, HER2-) - Signed by Truitt Merle, MD on 06/07/2021    Malignant neoplasm of upper-inner quadrant of left breast in female, estrogen receptor positive ([place])  05/13/2021 Mammogram   EXAM: DIGITAL DIAGNOSTIC UNILATERAL LEFT MAMMOGRAM WITH TOMOSYNTHESIS AND CAD;  ULTRASOUND LEFT BREAST LIMITED  IMPRESSION: 1. There is a highly suspicious 1.1 cm mass in the left breast at 10 o'clock.   2.  No evidence of left axillary lymphadenopathy.   05/23/2021 Cancer Staging   Staging form: Breast, AJCC 8th Edition - Clinical stage from 05/23/2021: Stage IA (cT1c, cN0, cM0, G1, ER+, PR+, HER2-) - Signed by Truitt Merle, MD on 06/07/2021 Stage prefix: Initial diagnosis Histologic grading system: 3 grade system    05/23/2021 Pathology Results   Diagnosis Breast, left, needle core biopsy, 10 o'clock 5 cmfn - INVASIVE MAMMARY  CARCINOMA - MAMMARY CARCINOMA IN-SITU - SEE COMMENT Microscopic Comment The biopsy material shows an infiltrative proliferation of cells with arranged linearly and in small clusters. Based on the biopsy, the carcinoma appears [place]  grade 1-2 of 3 and measures 0.5 cm in greatest linear extent.  E-cadherin is negative supporting lobular origin.  PROGNOSTIC INDICATORS Results: The tumor cells are EQUIVOCAL for Her2 (2+). Her2 by FISH will be performed and results reported separately. Estrogen Receptor: 100%, POSITIVE, STRONG STAINING INTENSITY Progesterone Receptor: 100%, POSITIVE, STRONG STAINING INTENSITY Proliferation Marker Ki67: 5%  FLUORESCENCE IN-SITU HYBRIDIZATION Results: GROUP 5: HER2 **NEGATIVE**   06/07/2021 Initial Diagnosis   Malignant neoplasm of upper-inner quadrant of left breast in female, estrogen receptor positive ([place])      HISTORY OF PRESENTING ILLNESS:  Jillian Tyler 75 y.o. female is a here because of breast cancer. The patient was referred by Dr. Donne Hazel. The patient presents to the clinic today alone.   She had routine screening mammography on 04/25/21 showing a possible abnormality in the left breast. She underwent left diagnostic mammography and left breast ultrasonography on 05/13/21 showing: 1.1 cm mass in left breast at 10 o'clock; no left axillary lymphadenopathy.  Biopsy on 05/23/21 showed: invasive and in situ mammary carcinoma, e-cadherin negative, grade 1-2. Prognostic indicators significant for: estrogen receptor, 100% positive and progesterone receptor, 100% positive. Proliferation marker Ki67 at 5%. HER2 negative by FISH.   Today the patient notes they felt/feeling prior/after... -she reports she is not sleeping well -she notes a sinus infection and has completed a Z-pack.   She has a PMHx of.... -prior aspiration of breast cyst many years ago   Socially... -she is married with one child -renal cancer in her brother ("he's now cancer free"), prostate cancer in her father -she is currently smoking 2 cigarettes a day, trying to quit.   GYN HISTORY  Menarchal: "in high school, I did a lot of sports" LMP: "many years ago" Contraceptive: HRT: yes, currently on  progesterone for hot flashes GP: 1   REVIEW OF SYSTEMS:    Constitutional: Denies fevers, chills or abnormal night sweats Eyes: Denies blurriness of vision, double vision or watery eyes Ears, nose, mouth, throat, and face: Denies mucositis or sore throat Respiratory: Denies cough, dyspnea or wheezes Cardiovascular: Denies palpitation, chest discomfort or lower extremity swelling Gastrointestinal:  Denies nausea, heartburn or change in bowel habits Skin: Denies abnormal skin rashes Lymphatics: Denies new lymphadenopathy or easy bruising Neurological:Denies numbness, tingling or new weaknesses Behavioral/Psych: Mood is stable, no new changes  All other systems were reviewed with the patient and are negative.   MEDICAL HISTORY:  Past Medical History:  Diagnosis Date   Allergy    Breast cancer ([place])     SURGICAL HISTORY: Past Surgical History:  Procedure Laterality Date   BREAST CYST ASPIRATION Right    20 + yrs ago    SOCIAL HISTORY: Social History   Socioeconomic History   Marital status: Married    Spouse name: Not on file   Number of children: 1   Years of education: Not on file   Highest education level: Not on file  Occupational History   Not on file  Tobacco Use   Smoking status: Light Smoker    Packs/day: 0.25    Years: 40.00    Pack years: 10.00    Types: Cigarettes    Start date: 76   Smokeless tobacco: Never   Tobacco comments:    2 cigs daily-ep 05/31/21  Substance  and Sexual Activity   Alcohol use: Never   Drug use: Never   Sexual activity: Yes  Other Topics Concern   Not on file  Social History Narrative   Not on file   Social Determinants of Health   Financial Resource Strain: Not on file  Food Insecurity: Not on file  Transportation Needs: Not on file  Physical Activity: Not on file  Stress: Not on file  Social Connections: Not on file  Intimate Partner Violence: Not At Risk   Fear of Current or Ex-Partner: No   Emotionally Abused:  No   Physically Abused: No   Sexually Abused: No    FAMILY HISTORY: Family History  Problem Relation Age of Onset   Stroke Mother    Prostate cancer Father    Kidney cancer Brother    Lung disease Neg Hx     ALLERGIES:  is allergic to hydrocod polst-cpm polst er, influenza vaccines, latex, and augmentin [amoxicillin-pot clavulanate].  MEDICATIONS:  Current Outpatient Medications  Medication Sig Dispense Refill   multivitamin-iron-minerals-folic acid (CENTRUM) chewable tablet Chew 1 tablet by mouth daily.     omega-3 acid ethyl esters (LOVAZA) 1 g capsule Take by mouth 2 (two) times daily.     traZODone (DESYREL) 100 MG tablet TAKE 1 TABLET BY MOUTH AT BEDTIME AS NEEDED FOR INSOMNIA     VITAMIN D PO Take by mouth.     No current facility-administered medications for this visit.    PHYSICAL EXAMINATION: ECOG PERFORMANCE STATUS: 0 - Asymptomatic  Vitals:   06/08/21 1122  BP: 124/71  Pulse: 69  Resp: 18  Temp: 97.7 F (36.5 C)  SpO2: 97%   Filed Weights   06/08/21 1122  Weight: 104 lb 6.4 oz (47.4 kg)    GENERAL:alert, no distress and comfortable SKIN: skin color, texture, turgor are normal, no rashes or significant lesions EYES: normal, Conjunctiva are pink and non-injected, sclera clear  NECK: supple, thyroid normal size, non-tender, without nodularity LYMPH:  no palpable lymphadenopathy in the cervical, axillary  LUNGS: clear to auscultation and percussion with normal breathing effort HEART: regular rate & rhythm and no murmurs and no lower extremity edema Musculoskeletal:no cyanosis of digits and no clubbing  NEURO: alert & oriented x 3 with fluent speech, no focal motor/sensory deficits BREAST: No palpable mass, nodules or adenopathy bilaterally. Breast exam benign.  LABORATORY DATA:  I have reviewed the data as listed No flowsheet data found.  No flowsheet data found.   RADIOGRAPHIC STUDIES: I have personally reviewed the radiological images as listed  and agreed with the findings in the report. CT CHEST W CONTRAST  Addendum Date: 05/13/2021   ADDENDUM REPORT: 05/13/2021 08:24 ADDENDUM: Original report by Dr. Nyoka Cowden. Addendum by Dr. Jeralyn Ruths for purposes of documentation of a contrast extravasation: Type of contrast:  Isovue 300 Site of extravasation: Right antecubital fossa Estimated volume of extravasation: 75 ml Area of extravasation scanned with CT? Partially PATIENT'S SIGNS AND SYMPTOMS Skin blistering/ulceration: no Decrease capillary refill: no Change in skin color: no Decreased motor function or severe tightness: no Decreased pulses distal to site of extravasation: no Altered sensation: no Increasing pain or signs of increased swelling during observation: no TREATMENT Observation period at site: 20 minutes Limb elevation: yes Ice packs applied: yes Plastic surgery consulted? no DOCUMENTATION AND FOLLOW-UP Patient instructed to call [phone] or seek immediate medical care if symptoms progress. Electronically Signed   By: Logan Bores M.D.   On: 05/13/2021 08:24   Result Date: 05/13/2021  CLINICAL DATA:  Pulmonary emphysema.  Abnormal lung finding. EXAM: CT CHEST WITH CONTRAST TECHNIQUE: Multidetector CT imaging of the chest was performed during intravenous contrast administration. CONTRAST:  13m ISOVUE-300 IOPAMIDOL (ISOVUE-300) INJECTION 61% COMPARISON:  October 25, 2020. FINDINGS: Cardiovascular: Atherosclerosis of thoracic aorta is noted without aneurysm formation. Normal cardiac size. No pericardial effusion. Mild coronary artery calcifications are noted. Mediastinum/Nodes: No enlarged mediastinal, hilar, or axillary lymph nodes. Thyroid gland, trachea, and esophagus demonstrate no significant findings. Lungs/Pleura: No pneumothorax or pleural effusion is noted. Emphysematous disease is noted bilaterally. Stable appearance of collapse of right middle lobe is noted. Obstructive central endobronchial lesion cannot be excluded. Upper Abdomen: No acute  abnormality. Musculoskeletal: No chest wall abnormality. No acute or significant osseous findings. IMPRESSION: There remains complete collapse or atelectasis of the right middle lobe, potentially due to obstructive central endobronchial lesion or mucous plugging. Bronchoscopy is recommended for further evaluation. Aortic Atherosclerosis (ICD10-I70.0) and Emphysema (ICD10-J43.9). Electronically Signed: By: JMarijo ConceptionM.D. On: 05/10/2021 15:32   [place] L[place] UNI LEFT INC AXILLA  Result Date: 05/13/2021 CLINICAL DATA:  75year old female presenting for evaluation of a possible mass in the left breast. EXAM: DIGITAL DIAGNOSTIC UNILATERAL LEFT MAMMOGRAM WITH TOMOSYNTHESIS AND CAD; ULTRASOUND LEFT BREAST LIMITED TECHNIQUE: Left digital diagnostic mammography and breast tomosynthesis was performed. The images were evaluated with computer-aided detection.; Targeted ultrasound examination of the left breast was performed. COMPARISON:  Previous exam(s). ACR Breast Density Category b: There are scattered areas of fibroglandular density. FINDINGS: Spot compression tomosynthesis images through the medial far posterior left breast demonstrates a new irregular mass with indistinct margins measuring approximately 1.1 cm. Ultrasound targeted to the left breast at 10 o'clock, 5 cm from the nipple demonstrates an irregular hypoechoic shadowing mass measuring 1.1 x 0.7 x 0.9 cm. Ultrasound of the left axilla demonstrates multiple normal-appearing lymph nodes. IMPRESSION: 1. There is a highly suspicious 1.1 cm mass in the left breast at 10 o'clock. 2.  No evidence of left axillary lymphadenopathy. RECOMMENDATION: Ultrasound guided biopsy is recommended for the left breast mass. This has been scheduled for 05/23/2021 at 12:45 p.m. I have discussed the findings and recommendations with the patient. If applicable, a reminder letter will be sent to the patient regarding the next appointment. BI-RADS CATEGORY  5: Highly suggestive  of malignancy. Electronically Signed   By: MAmmie FerrierM.D.   On: 05/13/2021 12:55  MM DIAG BREAST TOMO UNI LEFT  Result Date: 05/13/2021 CLINICAL DATA:  75year old female presenting for evaluation of a possible mass in the left breast. EXAM: DIGITAL DIAGNOSTIC UNILATERAL LEFT MAMMOGRAM WITH TOMOSYNTHESIS AND CAD; ULTRASOUND LEFT BREAST LIMITED TECHNIQUE: Left digital diagnostic mammography and breast tomosynthesis was performed. The images were evaluated with computer-aided detection.; Targeted ultrasound examination of the left breast was performed. COMPARISON:  Previous exam(s). ACR Breast Density Category b: There are scattered areas of fibroglandular density. FINDINGS: Spot compression tomosynthesis images through the medial far posterior left breast demonstrates a new irregular mass with indistinct margins measuring approximately 1.1 cm. Ultrasound targeted to the left breast at 10 o'clock, 5 cm from the nipple demonstrates an irregular hypoechoic shadowing mass measuring 1.1 x 0.7 x 0.9 cm. Ultrasound of the left axilla demonstrates multiple normal-appearing lymph nodes. IMPRESSION: 1. There is a highly suspicious 1.1 cm mass in the left breast at 10 o'clock. 2.  No evidence of left axillary lymphadenopathy. RECOMMENDATION: Ultrasound guided biopsy is recommended for the left breast mass. This has been scheduled for 05/23/2021 at 12:45  p.m. I have discussed the findings and recommendations with the patient. If applicable, a reminder letter will be sent to the patient regarding the next appointment. BI-RADS CATEGORY  5: Highly suggestive of malignancy. Electronically Signed   By: Ammie Ferrier M.D.   On: 05/13/2021 12:55  MM CLIP PLACEMENT LEFT  Result Date: 05/23/2021 CLINICAL DATA:  Status post left breast ultrasound biopsy. EXAM: 3D DIAGNOSTIC LEFT MAMMOGRAM POST ULTRASOUND BIOPSY COMPARISON:  Previous exam(s). FINDINGS: 3D Mammographic images were obtained following ultrasound guided  biopsy of the upper inner left breast. The biopsy marking clip is in expected position at the site of biopsy. IMPRESSION: Appropriate positioning of the ribbon shaped biopsy marking clip at the site of biopsy in the upper left breast. Final Assessment: Post Procedure Mammograms for Marker Placement Electronically Signed   By: Kristopher Oppenheim M.D.   On: 05/23/2021 13:37  [place] LT BREAST BX W LOC DEV 1ST LESION IMG BX SPEC US GUIDE  Addendum Date: 05/25/2021   ADDENDUM REPORT: 05/25/2021 08:16 ADDENDUM: Pathology revealed GRADE 1/2 INVASIVE MAMMARY CARCINOMA- MAMMARY CARCINOMA IN-SITU of the LEFT breast, 10 o'clock, 5cmfn, ribbon clip. This was found to be concordant by Dr. Kristopher Oppenheim. Pathology results were discussed with the patient by telephone. The patient reported doing well after the biopsy with tenderness at the site. Post biopsy instructions and care were reviewed and questions were answered. The patient was encouraged to call [facility] for any additional concerns. Surgical consultation has been arranged with Dr. Donnie Mesa at [facility] on May 27, 2021. Pathology results reported by Stacie Acres RN on 05/25/2021. Electronically Signed   By: Kristopher Oppenheim M.D.   On: 05/25/2021 08:16   Result Date: 05/25/2021 CLINICAL DATA:  75 year old female with a suspicious left breast mass. EXAM: ULTRASOUND GUIDED LEFT BREAST CORE NEEDLE BIOPSY COMPARISON:  Previous exam(s). PROCEDURE: I met with the patient and we discussed the procedure of ultrasound-guided biopsy, including benefits and alternatives. We discussed the high likelihood of a successful procedure. We discussed the risks of the procedure, including infection, bleeding, tissue injury, clip migration, and inadequate sampling. Informed written consent was given. The usual time-out protocol was performed immediately prior to the procedure. Lesion quadrant: Upper inner quadrant Using sterile technique and  1% Lidocaine as local anesthetic, under direct ultrasound visualization, a 14 gauge spring-loaded device was used to perform biopsy of a mass at the 10 o'clock position of the left breast using a inferior approach. At the conclusion of the procedure a ribbon shaped tissue marker clip was deployed into the biopsy cavity. Follow up 2 view mammogram was performed and dictated separately. IMPRESSION: Ultrasound guided biopsy of the left breast. No apparent complications. Electronically Signed: By: Kristopher Oppenheim M.D. On: 05/23/2021 13:20    No orders of the defined types were placed in this encounter.   All questions were answered. The patient knows to call the clinic with any problems, questions or concerns. The total time spent in the appointment was 45 minutes.     Truitt Merle, MD 06/08/2021   I, Wilburn Mylar, am acting as scribe for Truitt Merle, MD.   I have reviewed the above documentation for accuracy and completeness, and I agree with the above.

## 2021-06-09 ENCOUNTER — Telehealth: Payer: Self-pay | Admitting: *Deleted

## 2021-06-09 ENCOUNTER — Encounter: Payer: Self-pay | Admitting: *Deleted

## 2021-06-09 ENCOUNTER — Other Ambulatory Visit: Payer: Self-pay | Admitting: General Surgery

## 2021-06-09 DIAGNOSIS — Z170 Estrogen receptor positive status [ER+]: Secondary | ICD-10-CM

## 2021-06-09 NOTE — Telephone Encounter (Signed)
Spoke with patient to follow up from new patient appt and assess navigation needs.  Patient requests I email with the contact information.  Information emailed. Encouraged her to call should she have any questions or concerns.

## 2021-06-09 NOTE — Progress Notes (Signed)
Radiation Oncology         [phone] ________________________________  Name: Jillian Tyler MRN: [id number]  Date: 06/07/2021  DOB: 19-Jan-1946  GY:JEHUDJ, Jillian Brow, MD  Rolm Bookbinder, MD     REFERRING PHYSICIAN: Rolm Bookbinder, MD   DIAGNOSIS: The encounter diagnosis was Malignant neoplasm of upper-inner quadrant of left breast in female, estrogen receptor positive ([place]).  Cancer Staging Malignant neoplasm of upper-inner quadrant of left breast in female, estrogen receptor positive ([place]) Staging form: Breast, AJCC 8th Edition - Clinical stage from 05/23/2021: Stage IA (cT1c, cN0, cM0, G1, ER+, PR+, HER2-) - Signed by Truitt Merle, MD on 06/07/2021 Stage prefix: Initial diagnosis Histologic grading system: 3 grade system    HISTORY OF PRESENT ILLNESS::Jillian Tyler is a 75 y.o. female who is seen for an initial consultation visit regarding the patient's diagnosis of breast cancer.  The patient was found to have suspicious findings within the left breast on initial mammogram. The patient has not had symptoms prior to this study. A diagnostic mammogram and breast ultrasound confirmed this finding. On ultrasound, the tumor measured 1.1 cm and was present in the upper/ outer quadrant.  A biopsy was performed. This revealed Invasive lobular carcinoma. Receptors studies were completed and indicate that the tumor is estrogen receptor positive, progesterone receptor positive, and Her-2/neu negative. The Ki-67 staining was 5%.   The patient has not undergone an MRI scan of the breasts.    PREVIOUS RADIATION THERAPY: No   PAST MEDICAL HISTORY:  has a past medical history of Allergy and Breast cancer ([place]).     PAST SURGICAL HISTORY: Past Surgical History:  Procedure Laterality Date   BREAST CYST ASPIRATION Right    20 + yrs ago     FAMILY HISTORY: family history includes Kidney cancer in her brother; Prostate cancer in her father; Stroke in her mother.   SOCIAL HISTORY:   reports that she has been smoking cigarettes. She started smoking about 50 years ago. She has a 10.00 pack-year smoking history. She has never used smokeless tobacco. She reports that she does not drink alcohol and does not use drugs.   ALLERGIES: Hydrocod polst-cpm polst er, Influenza vaccines, Latex, and Augmentin [amoxicillin-pot clavulanate]   MEDICATIONS:  Current Outpatient Medications  Medication Sig Dispense Refill   multivitamin-iron-minerals-folic acid (CENTRUM) chewable tablet Chew 1 tablet by mouth daily.     omega-3 acid ethyl esters (LOVAZA) 1 g capsule Take by mouth 2 (two) times daily.     traZODone (DESYREL) 100 MG tablet TAKE 1 TABLET BY MOUTH AT BEDTIME AS NEEDED FOR INSOMNIA     VITAMIN D PO Take by mouth.     No current facility-administered medications for this encounter.     REVIEW OF SYSTEMS:  A 15 point review of systems is documented in the electronic medical record. This was obtained by the nursing staff. However, I reviewed this with the patient to discuss relevant findings and make appropriate changes.  Pertinent items are noted in HPI.    PHYSICAL EXAM:  height is $RemoveB'5\' 4"'efaThpFm$  (1.626 m) and weight is 109 lb (49.4 kg).   ECOG = 0  0 - Asymptomatic (Fully active, able to carry on all predisease activities without restriction)  1 - Symptomatic but completely ambulatory (Restricted in physically strenuous activity but ambulatory and able to carry out work of a light or sedentary nature. For example, light housework, office work)  2 - Symptomatic, <50% in bed during the day (Ambulatory and capable of all  self care but unable to carry out any work activities. Up and about more than 50% of waking hours)  3 - Symptomatic, >50% in bed, but not bedbound (Capable of only limited self-care, confined to bed or chair 50% or more of waking hours)  4 - Bedbound (Completely disabled. Cannot carry on any self-care. Totally confined to bed or chair)  5 - Death   Eustace Pen MM,  Creech RH, Tormey DC, et al. [phone]). "Toxicity and response criteria of the [facility]". [place] Oncol. 5 (6): 649-55  General: alert, no distress      LABORATORY DATA:  No results found for: WBC, HGB, HCT, MCV, PLT No results found for: NA, K, CL, CO2 No results found for: ALT, AST, GGT, ALKPHOS, BILITOT    RADIOGRAPHY: CT CHEST W CONTRAST  Addendum Date: 05/13/2021   ADDENDUM REPORT: 05/13/2021 08:24 ADDENDUM: Original report by Dr. Nyoka Cowden. Addendum by Dr. Jeralyn Ruths for purposes of documentation of a contrast extravasation: Type of contrast:  Isovue 300 Site of extravasation: Right antecubital fossa Estimated volume of extravasation: 75 ml Area of extravasation scanned with CT? Partially PATIENT'S SIGNS AND SYMPTOMS Skin blistering/ulceration: no Decrease capillary refill: no Change in skin color: no Decreased motor function or severe tightness: no Decreased pulses distal to site of extravasation: no Altered sensation: no Increasing pain or signs of increased swelling during observation: no TREATMENT Observation period at site: 20 minutes Limb elevation: yes Ice packs applied: yes Plastic surgery consulted? no DOCUMENTATION AND FOLLOW-UP Patient instructed to call [phone] or seek immediate medical care if symptoms progress. Electronically Signed   By: Logan Bores M.D.   On: 05/13/2021 08:24   Result Date: 05/13/2021 CLINICAL DATA:  Pulmonary emphysema.  Abnormal lung finding. EXAM: CT CHEST WITH CONTRAST TECHNIQUE: Multidetector CT imaging of the chest was performed during intravenous contrast administration. CONTRAST:  66mL ISOVUE-300 IOPAMIDOL (ISOVUE-300) INJECTION 61% COMPARISON:  October 25, 2020. FINDINGS: Cardiovascular: Atherosclerosis of thoracic aorta is noted without aneurysm formation. Normal cardiac size. No pericardial effusion. Mild coronary artery calcifications are noted. Mediastinum/Nodes: No enlarged mediastinal, hilar, or axillary lymph nodes.  Thyroid gland, trachea, and esophagus demonstrate no significant findings. Lungs/Pleura: No pneumothorax or pleural effusion is noted. Emphysematous disease is noted bilaterally. Stable appearance of collapse of right middle lobe is noted. Obstructive central endobronchial lesion cannot be excluded. Upper Abdomen: No acute abnormality. Musculoskeletal: No chest wall abnormality. No acute or significant osseous findings. IMPRESSION: There remains complete collapse or atelectasis of the right middle lobe, potentially due to obstructive central endobronchial lesion or mucous plugging. Bronchoscopy is recommended for further evaluation. Aortic Atherosclerosis (ICD10-I70.0) and Emphysema (ICD10-J43.9). Electronically Signed: By: Marijo Conception M.D. On: 05/10/2021 15:32   US BREAST LTD UNI LEFT INC AXILLA  Result Date: 05/13/2021 CLINICAL DATA:  75 year old female presenting for evaluation of a possible mass in the left breast. EXAM: DIGITAL DIAGNOSTIC UNILATERAL LEFT MAMMOGRAM WITH TOMOSYNTHESIS AND CAD; ULTRASOUND LEFT BREAST LIMITED TECHNIQUE: Left digital diagnostic mammography and breast tomosynthesis was performed. The images were evaluated with computer-aided detection.; Targeted ultrasound examination of the left breast was performed. COMPARISON:  Previous exam(s). ACR Breast Density Category b: There are scattered areas of fibroglandular density. FINDINGS: Spot compression tomosynthesis images through the medial far posterior left breast demonstrates a new irregular mass with indistinct margins measuring approximately 1.1 cm. Ultrasound targeted to the left breast at 10 o'clock, 5 cm from the nipple demonstrates an irregular hypoechoic shadowing mass measuring 1.1 x 0.7 x 0.9  cm. Ultrasound of the left axilla demonstrates multiple normal-appearing lymph nodes. IMPRESSION: 1. There is a highly suspicious 1.1 cm mass in the left breast at 10 o'clock. 2.  No evidence of left axillary lymphadenopathy.  RECOMMENDATION: Ultrasound guided biopsy is recommended for the left breast mass. This has been scheduled for 05/23/2021 at 12:45 p.m. I have discussed the findings and recommendations with the patient. If applicable, a reminder letter will be sent to the patient regarding the next appointment. BI-RADS CATEGORY  5: Highly suggestive of malignancy. Electronically Signed   By: Ammie Ferrier M.D.   On: 05/13/2021 12:55  MM DIAG BREAST TOMO UNI LEFT  Result Date: 05/13/2021 CLINICAL DATA:  75 year old female presenting for evaluation of a possible mass in the left breast. EXAM: DIGITAL DIAGNOSTIC UNILATERAL LEFT MAMMOGRAM WITH TOMOSYNTHESIS AND CAD; ULTRASOUND LEFT BREAST LIMITED TECHNIQUE: Left digital diagnostic mammography and breast tomosynthesis was performed. The images were evaluated with computer-aided detection.; Targeted ultrasound examination of the left breast was performed. COMPARISON:  Previous exam(s). ACR Breast Density Category b: There are scattered areas of fibroglandular density. FINDINGS: Spot compression tomosynthesis images through the medial far posterior left breast demonstrates a new irregular mass with indistinct margins measuring approximately 1.1 cm. Ultrasound targeted to the left breast at 10 o'clock, 5 cm from the nipple demonstrates an irregular hypoechoic shadowing mass measuring 1.1 x 0.7 x 0.9 cm. Ultrasound of the left axilla demonstrates multiple normal-appearing lymph nodes. IMPRESSION: 1. There is a highly suspicious 1.1 cm mass in the left breast at 10 o'clock. 2.  No evidence of left axillary lymphadenopathy. RECOMMENDATION: Ultrasound guided biopsy is recommended for the left breast mass. This has been scheduled for 05/23/2021 at 12:45 p.m. I have discussed the findings and recommendations with the patient. If applicable, a reminder letter will be sent to the patient regarding the next appointment. BI-RADS CATEGORY  5: Highly suggestive of malignancy. Electronically  Signed   By: Ammie Ferrier M.D.   On: 05/13/2021 12:55  MM CLIP PLACEMENT LEFT  Result Date: 05/23/2021 CLINICAL DATA:  Status post left breast ultrasound biopsy. EXAM: 3D DIAGNOSTIC LEFT MAMMOGRAM POST ULTRASOUND BIOPSY COMPARISON:  Previous exam(s). FINDINGS: 3D Mammographic images were obtained following ultrasound guided biopsy of the upper inner left breast. The biopsy marking clip is in expected position at the site of biopsy. IMPRESSION: Appropriate positioning of the ribbon shaped biopsy marking clip at the site of biopsy in the upper left breast. Final Assessment: Post Procedure Mammograms for Marker Placement Electronically Signed   By: Kristopher Oppenheim M.D.   On: 05/23/2021 13:37  [place] LT BREAST BX W LOC DEV 1ST LESION IMG BX SPEC US GUIDE  Addendum Date: 05/25/2021   ADDENDUM REPORT: 05/25/2021 08:16 ADDENDUM: Pathology revealed GRADE 1/2 INVASIVE MAMMARY CARCINOMA- MAMMARY CARCINOMA IN-SITU of the LEFT breast, 10 o'clock, 5cmfn, ribbon clip. This was found to be concordant by Dr. Kristopher Oppenheim. Pathology results were discussed with the patient by telephone. The patient reported doing well after the biopsy with tenderness at the site. Post biopsy instructions and care were reviewed and questions were answered. The patient was encouraged to call [facility] for any additional concerns. Surgical consultation has been arranged with Dr. Donnie Mesa at [facility] on May 27, 2021. Pathology results reported by Stacie Acres RN on 05/25/2021. Electronically Signed   By: Kristopher Oppenheim M.D.   On: 05/25/2021 08:16   Result Date: 05/25/2021 CLINICAL DATA:  75 year old female with a suspicious left  breast mass. EXAM: ULTRASOUND GUIDED LEFT BREAST CORE NEEDLE BIOPSY COMPARISON:  Previous exam(s). PROCEDURE: I met with the patient and we discussed the procedure of ultrasound-guided biopsy, including benefits and alternatives. We discussed the high likelihood  of a successful procedure. We discussed the risks of the procedure, including infection, bleeding, tissue injury, clip migration, and inadequate sampling. Informed written consent was given. The usual time-out protocol was performed immediately prior to the procedure. Lesion quadrant: Upper inner quadrant Using sterile technique and 1% Lidocaine as local anesthetic, under direct ultrasound visualization, a 14 gauge spring-loaded device was used to perform biopsy of a mass at the 10 o'clock position of the left breast using a inferior approach. At the conclusion of the procedure a ribbon shaped tissue marker clip was deployed into the biopsy cavity. Follow up 2 view mammogram was performed and dictated separately. IMPRESSION: Ultrasound guided biopsy of the left breast. No apparent complications. Electronically Signed: By: Kristopher Oppenheim M.D. On: 05/23/2021 13:20      IMPRESSION:  Oncology History Overview Note  Cancer Staging Malignant neoplasm of upper-inner quadrant of left breast in female, estrogen receptor positive ([place]) Staging form: Breast, AJCC 8th Edition - Clinical stage from 05/23/2021: Stage IA (cT1c, cN0, cM0, G1, ER+, PR+, HER2-) - Signed by Truitt Merle, MD on 06/07/2021    Malignant neoplasm of upper-inner quadrant of left breast in female, estrogen receptor positive ([place])  05/13/2021 Mammogram   EXAM: DIGITAL DIAGNOSTIC UNILATERAL LEFT MAMMOGRAM WITH TOMOSYNTHESIS AND CAD;  ULTRASOUND LEFT BREAST LIMITED  IMPRESSION: 1. There is a highly suspicious 1.1 cm mass in the left breast at 10 o'clock.   2.  No evidence of left axillary lymphadenopathy.   05/23/2021 Cancer Staging   Staging form: Breast, AJCC 8th Edition - Clinical stage from 05/23/2021: Stage IA (cT1c, cN0, cM0, G1, ER+, PR+, HER2-) - Signed by Truitt Merle, MD on 06/07/2021 Stage prefix: Initial diagnosis Histologic grading system: 3 grade system    05/23/2021 Pathology Results   Diagnosis Breast, left, needle core  biopsy, 10 o'clock 5 cmfn - INVASIVE MAMMARY CARCINOMA - MAMMARY CARCINOMA IN-SITU - SEE COMMENT Microscopic Comment The biopsy material shows an infiltrative proliferation of cells with arranged linearly and in small clusters. Based on the biopsy, the carcinoma appears Nottingham grade 1-2 of 3 and measures 0.5 cm in greatest linear extent.  E-cadherin is negative supporting lobular origin.  PROGNOSTIC INDICATORS Results: The tumor cells are EQUIVOCAL for Her2 (2+). Her2 by FISH will be performed and results reported separately. Estrogen Receptor: 100%, POSITIVE, STRONG STAINING INTENSITY Progesterone Receptor: 100%, POSITIVE, STRONG STAINING INTENSITY Proliferation Marker Ki67: 5%  FLUORESCENCE IN-SITU HYBRIDIZATION Results: GROUP 5: HER2 **NEGATIVE**   06/07/2021 Initial Diagnosis   Malignant neoplasm of upper-inner quadrant of left breast in female, estrogen receptor positive ([place])     The patient has a recent diagnosis of invasive lobular carcinoma of the left breast. She appears to be a good candidate for breast conservation treatment.  I discussed with the patient the potential role of adjuvant radiation treatment in this setting. We discussed the potential benefit of radiation treatment, especially with regards to local control of the patient's tumor. We also discussed the possible side effects and risks of such a treatment as well.  We discussed that the results from surgery provide a lot of information and that it would be appropriate for [place] to see her postoperatively to review the updated information at that time and to make a final decision with regards to possible  adjuvant radiation treatment.  In terms of general health, the patient appears to be doing very well from that standpoint and we did discuss that adjuvant radiation treatment may be reasonable to reduce the risk of local failure  All of the patient's questions were answered. The patient wishes to proceed with  radiation treatment at the appropriate time.  PLAN: I look forward to seeing the patient postoperatively to review her case and further discuss and coordinate a possible course of radiation treatment.    Given current concerns for patient exposure during the COVID-19 pandemic, this encounter was conducted via telephone.  The patient has given verbal consent for this type of encounter. The time spent during this encounter was 40 minutes and more than 50% of that time was spent in the coordination of the patient's care. The attendants for this meeting included Dr. Lisbeth Renshaw, and the patient.  During the encounter Dr. Lisbeth Renshaw was located at [facility] Radiation Oncology Department.  The patient  was located at home.     ________________________________   Jodelle Gross, MD, PhD   **Disclaimer: This note was dictated with voice recognition software. Similar sounding words can inadvertently be transcribed and this note may contain transcription errors which may not have been corrected upon publication of note.**

## 2021-06-13 ENCOUNTER — Other Ambulatory Visit: Payer: Self-pay | Admitting: General Surgery

## 2021-06-13 DIAGNOSIS — C50212 Malignant neoplasm of upper-inner quadrant of left female breast: Secondary | ICD-10-CM

## 2021-06-16 NOTE — Progress Notes (Signed)
Reviewed chart with Dr Sabra Heck and pt's surgery needs to be done at main OR due to current lung issues and to not delay surgery. Called Ivy at Dr. Cristal Generous office to make her aware to move case unless patient can complete her pulm workup prior to 11/28.

## 2021-06-20 NOTE — Progress Notes (Signed)
Surgical Instructions    Your procedure is scheduled on 06/27/21.  Report to [facility] Main Entrance "A" at 5:30 A.M., then check in with the Admitting office.  Call this number if you have problems the morning of surgery:  [phone]   If you have any questions prior to your surgery date call [phone]: Open Monday-Friday 8am-4pm    Remember:  Do not eat after midnight the night before your surgery  You may drink clear liquids until 4:30am the morning of your surgery.   Clear liquids allowed are: Water, Non-Citrus Juices (without pulp), Carbonated Beverages, Clear Tea, Black Coffee ONLY (NO MILK, CREAM OR POWDERED CREAMER of any kind), and Gatorade  Patient Instructions  The night before surgery:  No food after midnight. ONLY clear liquids after midnight  The day of surgery (if you do NOT have diabetes):  Drink ONE (1) Pre-Surgery Clear Ensure by 4:30am the morning of surgery. Drink in one sitting. Do not sip.  This drink was given to you during your hospital  pre-op appointment visit. Nothing else to drink after completing the  Pre-Surgery Clear Ensure.          If you have questions, please contact your surgeon's office.     Take these medicines the morning of surgery with A SIP OF WATER  acetaminophen (TYLENOL) if needed fluticasone (FLONASE) if needed   As of today, STOP taking any Aspirin (unless otherwise instructed by your surgeon) Aleve, Naproxen, Ibuprofen, Motrin, Advil, Goody's, BC's, all herbal medications, fish oil, and all vitamins.     After your COVID test   You are not required to quarantine however you are required to wear a well-fitting mask when you are out and around people not in your household.  If your mask becomes wet or soiled, replace with a new one.  Wash your hands often with soap and water for 20 seconds or clean your hands with an alcohol-based hand sanitizer that contains at least 60% alcohol.  Do not share personal  items.  Notify your provider: if you are in close contact with someone who has COVID  or if you develop a fever of 100.4 or greater, sneezing, cough, sore throat, shortness of breath or body aches.             Do not wear jewelry or makeup Do not wear lotions, powders, perfumes/colognes, or deodorant. Do not shave 48 hours prior to surgery.   Do not bring valuables to the hospital. DO Not wear nail polish, gel polish, artificial nails, or any other type of covering on natural nails including finger and toenails. If patients have artificial nails, gel coating, etc. that need to be removed by a nail salon, please have this removed prior to surgery or surgery may need to be canceled/delayed if the surgeon/ anesthesia feels like the patient is unable to be adequately monitored.             [facility] is not responsible for any belongings or valuables.  Do NOT Smoke (Tobacco/Vaping)  24 hours prior to your procedure  If you use a CPAP at night, you may bring your mask for your overnight stay.   Contacts, glasses, hearing aids, dentures or partials may not be worn into surgery, please bring cases for these belongings   For patients admitted to the hospital, discharge time will be determined by your treatment team.   Patients discharged the day of surgery will not be allowed to drive home, and someone needs  to stay with them for 24 hours.  NO VISITORS WILL BE ALLOWED IN PRE-OP WHERE PATIENTS ARE PREPPED FOR SURGERY.  ONLY 1 SUPPORT PERSON MAY BE PRESENT IN THE WAITING ROOM WHILE YOU ARE IN SURGERY.  IF YOU ARE TO BE ADMITTED, ONCE YOU ARE IN YOUR ROOM YOU WILL BE ALLOWED TWO (2) VISITORS. 1 (ONE) VISITOR MAY STAY OVERNIGHT BUT MUST ARRIVE TO THE ROOM BY 8pm.  Minor children may have two parents present. Special consideration for safety and communication needs will be reviewed on a case by case basis.  Special instructions:    Oral Hygiene is also important to reduce your risk of infection.   Remember - BRUSH YOUR TEETH THE MORNING OF SURGERY WITH YOUR REGULAR TOOTHPASTE   [facility]- Preparing For Surgery  Before surgery, you can play an important role. Because skin is not sterile, your skin needs to be as free of germs as possible. You can reduce the number of germs on your skin by washing with CHG (chlorahexidine gluconate) Soap before surgery.  CHG is an antiseptic cleaner which kills germs and bonds with the skin to continue killing germs even after washing.     Please do not use if you have an allergy to CHG or antibacterial soaps. If your skin becomes reddened/irritated stop using the CHG.  Do not shave (including legs and underarms) for at least 48 hours prior to first CHG shower. It is OK to shave your face.  Please follow these instructions carefully.     Shower the NIGHT BEFORE SURGERY and the MORNING OF SURGERY with CHG Soap.   If you chose to wash your hair, wash your hair first as usual with your normal shampoo. After you shampoo, rinse your hair and body thoroughly to remove the shampoo.  Then [employer] and genitals (private parts) with your normal soap and rinse thoroughly to remove soap.  After that Use CHG Soap as you would any other liquid soap. You can apply CHG directly to the skin and wash gently with a scrungie or a clean washcloth.   Apply the CHG Soap to your body ONLY FROM THE NECK DOWN.  Do not use on open wounds or open sores. Avoid contact with your eyes, ears, mouth and genitals (private parts). Wash Face and genitals (private parts)  with your normal soap.   Wash thoroughly, paying special attention to the area where your surgery will be performed.  Thoroughly rinse your body with warm water from the neck down.  DO NOT shower/wash with your normal soap after using and rinsing off the CHG Soap.  Pat yourself dry with a CLEAN TOWEL.  Wear CLEAN PAJAMAS to bed the night before surgery  Place CLEAN SHEETS on your bed the night before your  surgery  DO NOT SLEEP WITH PETS.   Day of Surgery: Take a shower with CHG soap. Wear Clean/Comfortable clothing the morning of surgery Do not apply any deodorants/lotions.   Remember to brush your teeth WITH YOUR REGULAR TOOTHPASTE.   Please read over the following fact sheets that you were given.

## 2021-06-21 ENCOUNTER — Other Ambulatory Visit: Payer: Self-pay

## 2021-06-21 ENCOUNTER — Encounter (HOSPITAL_COMMUNITY)
Admission: RE | Admit: 2021-06-21 | Discharge: 2021-06-21 | Disposition: A | Discharge status: Home / Self Care | Payer: PPO | Source: Ambulatory Visit | Attending: General Surgery | Admitting: General Surgery

## 2021-06-21 ENCOUNTER — Encounter (HOSPITAL_COMMUNITY): Payer: Self-pay

## 2021-06-21 VITALS — BP 122/56 | HR 66 | Temp 98.1°F | Resp 17 | Ht 64.0 in | Wt 105.9 lb

## 2021-06-21 DIAGNOSIS — Z01812 Encounter for preprocedural laboratory examination: Secondary | ICD-10-CM | POA: Diagnosis not present

## 2021-06-21 DIAGNOSIS — Z01818 Encounter for other preprocedural examination: Secondary | ICD-10-CM

## 2021-06-21 LAB — CBC
HCT: 38.8 % (ref 36.0–46.0)
Hemoglobin: 12.9 g/dL (ref 12.0–15.0)
MCH: 31.5 pg (ref 26.0–34.0)
MCHC: 33.2 g/dL (ref 30.0–36.0)
MCV: 94.9 fL (ref 80.0–100.0)
Platelets: 278 10*3/uL (ref 150–400)
RBC: 4.09 MIL/uL (ref 3.87–5.11)
RDW: 12.3 % (ref 11.5–15.5)
WBC: 8.1 10*3/uL (ref 4.0–10.5)
nRBC: 0 % (ref 0.0–0.2)

## 2021-06-21 NOTE — Progress Notes (Signed)
PCP - Antony Contras Cardiologist - denies  PPM/ICD - denies   Chest x-ray - n/a EKG - n/a Stress Test - over 10 years ago, normal per patient ECHO - denies Cardiac Cath - denies  Sleep Study - denies   No diabetes    As of today, STOP taking any Aspirin (unless otherwise instructed by your surgeon) Aleve, Naproxen, Ibuprofen, Motrin, Advil, Goody's, BC's, all herbal medications, fish oil, and all vitamins.  ERAS Protcol -yes PRE-SURGERY Ensure or G2- ensure given  COVID TEST- ambulatory surgery   Anesthesia review: no  Patient denies shortness of breath, fever, cough and chest pain at PAT appointment   All instructions explained to the patient, with a verbal understanding of the material. Patient agrees to go over the instructions while at home for a better understanding. Patient also instructed to self quarantine after being tested for COVID-19. The opportunity to ask questions was provided.

## 2021-06-22 ENCOUNTER — Other Ambulatory Visit: Payer: Self-pay | Admitting: General Surgery

## 2021-06-22 ENCOUNTER — Ambulatory Visit
Admission: RE | Admit: 2021-06-22 | Discharge: 2021-06-22 | Disposition: A | Discharge status: Home / Self Care | Payer: PPO | Source: Ambulatory Visit | Attending: General Surgery | Admitting: General Surgery

## 2021-06-22 DIAGNOSIS — C50212 Malignant neoplasm of upper-inner quadrant of left female breast: Secondary | ICD-10-CM

## 2021-06-22 DIAGNOSIS — Z170 Estrogen receptor positive status [ER+]: Secondary | ICD-10-CM

## 2021-06-22 DIAGNOSIS — R928 Other abnormal and inconclusive findings on diagnostic imaging of breast: Secondary | ICD-10-CM | POA: Diagnosis not present

## 2021-06-26 NOTE — Anesthesia Preprocedure Evaluation (Addendum)
Anesthesia Evaluation  Patient identified by MRN, date of birth, ID band Patient awake    Reviewed: Allergy & Precautions, NPO status , Patient's Chart, lab work & pertinent test results  Airway Mallampati: II  TM Distance: >3 FB Neck ROM: Full    Dental  (+) Edentulous Upper, Edentulous Lower   Pulmonary Current Smoker and Patient abstained from smoking.,    Pulmonary exam normal breath sounds clear to auscultation       Cardiovascular Exercise Tolerance: Good Normal cardiovascular exam Rhythm:Regular Rate:Normal     Neuro/Psych negative neurological ROS  negative psych ROS   GI/Hepatic negative GI ROS, Neg liver ROS,   Endo/Other    Renal/GU negative Renal ROS     Musculoskeletal negative musculoskeletal ROS (+)   Abdominal Normal abdominal exam  (+)   Peds  Hematology negative hematology ROS (+)   Anesthesia Other Findings Breast cancer  Reproductive/Obstetrics negative OB ROS                            Anesthesia Physical Anesthesia Plan  ASA: 3  Anesthesia Plan: General   Post-op Pain Management:    Induction: Intravenous  PONV Risk Score and Plan: 2  Airway Management Planned: LMA  Additional Equipment: None  Intra-op Plan:   Post-operative Plan: Extubation in OR  Informed Consent: I have reviewed the patients History and Physical, chart, labs and discussed the procedure including the risks, benefits and alternatives for the proposed anesthesia with the patient or authorized representative who has indicated his/her understanding and acceptance.       Plan Discussed with: Anesthesiologist and CRNA  Anesthesia Plan Comments:        Anesthesia Quick Evaluation

## 2021-06-27 ENCOUNTER — Ambulatory Visit (HOSPITAL_COMMUNITY)
Admission: RE | Admit: 2021-06-27 | Discharge: 2021-06-27 | Disposition: A | Discharge status: Home / Self Care | Payer: PPO | Attending: General Surgery | Admitting: General Surgery

## 2021-06-27 ENCOUNTER — Encounter (HOSPITAL_COMMUNITY)
Admission: RE | Disposition: A | Discharge status: Home / Self Care | Payer: Self-pay | Source: Home / Self Care | Attending: General Surgery

## 2021-06-27 ENCOUNTER — Encounter (HOSPITAL_COMMUNITY): Payer: Self-pay | Admitting: General Surgery

## 2021-06-27 ENCOUNTER — Other Ambulatory Visit: Payer: Self-pay

## 2021-06-27 ENCOUNTER — Ambulatory Visit (HOSPITAL_COMMUNITY): Payer: PPO | Admitting: Anesthesiology

## 2021-06-27 ENCOUNTER — Ambulatory Visit
Admission: RE | Admit: 2021-06-27 | Discharge: 2021-06-27 | Disposition: A | Discharge status: Home / Self Care | Payer: PPO | Source: Ambulatory Visit | Attending: General Surgery | Admitting: General Surgery

## 2021-06-27 DIAGNOSIS — C50212 Malignant neoplasm of upper-inner quadrant of left female breast: Secondary | ICD-10-CM | POA: Insufficient documentation

## 2021-06-27 DIAGNOSIS — R928 Other abnormal and inconclusive findings on diagnostic imaging of breast: Secondary | ICD-10-CM | POA: Diagnosis not present

## 2021-06-27 DIAGNOSIS — Z170 Estrogen receptor positive status [ER+]: Secondary | ICD-10-CM | POA: Diagnosis not present

## 2021-06-27 DIAGNOSIS — C50912 Malignant neoplasm of unspecified site of left female breast: Secondary | ICD-10-CM | POA: Diagnosis not present

## 2021-06-27 DIAGNOSIS — F17210 Nicotine dependence, cigarettes, uncomplicated: Secondary | ICD-10-CM | POA: Insufficient documentation

## 2021-06-27 HISTORY — PX: BREAST LUMPECTOMY: SHX2

## 2021-06-27 HISTORY — PX: BREAST LUMPECTOMY WITH RADIOACTIVE SEED LOCALIZATION: SHX6424

## 2021-06-27 SURGERY — BREAST LUMPECTOMY WITH RADIOACTIVE SEED LOCALIZATION
Anesthesia: General | Site: Breast | Laterality: Left

## 2021-06-27 MED ORDER — PROPOFOL 10 MG/ML IV BOLUS
INTRAVENOUS | Status: AC
  Filled 2021-06-27: qty 40

## 2021-06-27 MED ORDER — CHLORHEXIDINE GLUCONATE CLOTH 2 % EX PADS: 6.0000 | MEDICATED_PAD | Freq: Once | CUTANEOUS | Status: DC

## 2021-06-27 MED ORDER — PROPOFOL 10 MG/ML IV BOLUS
INTRAVENOUS | Status: DC | PRN
  Administered 2021-06-27: 120 mg via INTRAVENOUS

## 2021-06-27 MED ORDER — EPHEDRINE 5 MG/ML INJ
INTRAVENOUS | Status: AC
  Filled 2021-06-27: qty 5

## 2021-06-27 MED ORDER — FENTANYL CITRATE (PF) 250 MCG/5ML IJ SOLN
INTRAMUSCULAR | Status: DC | PRN
  Administered 2021-06-27: 50 ug via INTRAVENOUS
  Administered 2021-06-27: 25 ug via INTRAVENOUS

## 2021-06-27 MED ORDER — CEFAZOLIN SODIUM-DEXTROSE 2-4 GM/100ML-% IV SOLN
2.0000 g | INTRAVENOUS | Status: AC
  Administered 2021-06-27: 08:00:00 2 g via INTRAVENOUS
  Filled 2021-06-27: qty 100

## 2021-06-27 MED ORDER — MIDAZOLAM HCL 2 MG/2ML IJ SOLN
INTRAMUSCULAR | Status: AC
  Filled 2021-06-27: qty 2

## 2021-06-27 MED ORDER — ORAL CARE MOUTH RINSE: 15.0000 mL | Freq: Once | OROMUCOSAL | Status: AC

## 2021-06-27 MED ORDER — CHLORHEXIDINE GLUCONATE 0.12 % MT SOLN
15.0000 mL | Freq: Once | OROMUCOSAL | Status: AC
  Administered 2021-06-27: 06:00:00 15 mL via OROMUCOSAL
  Filled 2021-06-27: qty 15

## 2021-06-27 MED ORDER — ACETAMINOPHEN 500 MG PO TABS
1000.0000 mg | ORAL_TABLET | ORAL | Status: AC
  Administered 2021-06-27: 06:00:00 1000 mg via ORAL
  Filled 2021-06-27: qty 2

## 2021-06-27 MED ORDER — LIDOCAINE 2% (20 MG/ML) 5 ML SYRINGE
INTRAMUSCULAR | Status: AC
  Filled 2021-06-27: qty 5

## 2021-06-27 MED ORDER — EPHEDRINE SULFATE-NACL 50-0.9 MG/10ML-% IV SOSY
PREFILLED_SYRINGE | INTRAVENOUS | Status: DC | PRN
  Administered 2021-06-27 (×3): 5 mg via INTRAVENOUS
  Administered 2021-06-27: 10 mg via INTRAVENOUS

## 2021-06-27 MED ORDER — ONDANSETRON HCL 4 MG/2ML IJ SOLN
INTRAMUSCULAR | Status: AC
  Filled 2021-06-27: qty 2

## 2021-06-27 MED ORDER — LIDOCAINE 2% (20 MG/ML) 5 ML SYRINGE
INTRAMUSCULAR | Status: DC | PRN
  Administered 2021-06-27: 60 mg via INTRAVENOUS

## 2021-06-27 MED ORDER — DEXAMETHASONE SODIUM PHOSPHATE 10 MG/ML IJ SOLN
INTRAMUSCULAR | Status: DC | PRN
  Administered 2021-06-27: 10 mg via INTRAVENOUS

## 2021-06-27 MED ORDER — 0.9 % SODIUM CHLORIDE (POUR BTL) OPTIME
TOPICAL | Status: DC | PRN
  Administered 2021-06-27: 08:00:00 1000 mL

## 2021-06-27 MED ORDER — BUPIVACAINE-EPINEPHRINE 0.25% -1:200000 IJ SOLN
INTRAMUSCULAR | Status: DC | PRN
  Administered 2021-06-27: 10 mL
  Administered 2021-06-27: 6 mL

## 2021-06-27 MED ORDER — DEXAMETHASONE SODIUM PHOSPHATE 10 MG/ML IJ SOLN
INTRAMUSCULAR | Status: AC
  Filled 2021-06-27: qty 1

## 2021-06-27 MED ORDER — LACTATED RINGERS IV SOLN: INTRAVENOUS | Status: DC

## 2021-06-27 MED ORDER — ONDANSETRON HCL 4 MG/2ML IJ SOLN
INTRAMUSCULAR | Status: DC | PRN
  Administered 2021-06-27: 4 mg via INTRAVENOUS

## 2021-06-27 MED ORDER — FENTANYL CITRATE (PF) 250 MCG/5ML IJ SOLN
INTRAMUSCULAR | Status: AC
  Filled 2021-06-27: qty 5

## 2021-06-27 MED ORDER — BUPIVACAINE-EPINEPHRINE (PF) 0.25% -1:200000 IJ SOLN
INTRAMUSCULAR | Status: AC
  Filled 2021-06-27: qty 30

## 2021-06-27 MED ORDER — ENSURE PRE-SURGERY PO LIQD: 296.0000 mL | Freq: Once | ORAL | Status: DC

## 2021-06-27 SURGICAL SUPPLY — 33 items
BAG COUNTER SPONGE SURGICOUNT (BAG) ×2 IMPLANT
BINDER BREAST LRG (GAUZE/BANDAGES/DRESSINGS) IMPLANT
CANISTER SUCT 3000ML PPV (MISCELLANEOUS) ×2 IMPLANT
CHLORAPREP W/TINT 26 (MISCELLANEOUS) ×2 IMPLANT
CLIP VESOCCLUDE MED 6/CT (CLIP) ×2 IMPLANT
COVER PROBE W GEL 5X96 (DRAPES) ×2 IMPLANT
COVER SURGICAL LIGHT HANDLE (MISCELLANEOUS) ×2 IMPLANT
DERMABOND ADVANCED (GAUZE/BANDAGES/DRESSINGS) ×1
DERMABOND ADVANCED .7 DNX12 (GAUZE/BANDAGES/DRESSINGS) ×1 IMPLANT
DEVICE DUBIN SPECIMEN MAMMOGRA (MISCELLANEOUS) ×2 IMPLANT
DRAPE CHEST BREAST 15X10 FENES (DRAPES) ×2 IMPLANT
ELECT COATED BLADE 2.86 ST (ELECTRODE) ×2 IMPLANT
ELECT REM PT RETURN 9FT ADLT (ELECTROSURGICAL) ×2
ELECTRODE REM PT RTRN 9FT ADLT (ELECTROSURGICAL) ×1 IMPLANT
GLOVE SURG ENC MOIS LTX SZ7 (GLOVE) ×4 IMPLANT
GLOVE SURG UNDER POLY LF SZ7.5 (GLOVE) ×2 IMPLANT
GOWN STRL REUS W/ TWL LRG LVL3 (GOWN DISPOSABLE) ×2 IMPLANT
GOWN STRL REUS W/TWL LRG LVL3 (GOWN DISPOSABLE) ×4
KIT BASIN OR (CUSTOM PROCEDURE TRAY) ×2 IMPLANT
KIT MARKER MARGIN INK (KITS) ×2 IMPLANT
NEEDLE HYPO 25GX1X1/2 BEV (NEEDLE) ×2 IMPLANT
NS IRRIG 1000ML POUR BTL (IV SOLUTION) ×2 IMPLANT
PACK GENERAL/GYN (CUSTOM PROCEDURE TRAY) ×2 IMPLANT
STRIP CLOSURE SKIN 1/2X4 (GAUZE/BANDAGES/DRESSINGS) ×2 IMPLANT
SUT MNCRL AB 4-0 PS2 18 (SUTURE) ×2 IMPLANT
SUT SILK 2 0 SH (SUTURE) ×2 IMPLANT
SUT VIC AB 2-0 SH 27 (SUTURE) ×4
SUT VIC AB 2-0 SH 27XBRD (SUTURE) ×2 IMPLANT
SUT VIC AB 3-0 SH 27 (SUTURE) ×2
SUT VIC AB 3-0 SH 27X BRD (SUTURE) ×1 IMPLANT
SYR CONTROL 10ML LL (SYRINGE) ×2 IMPLANT
TOWEL GREEN STERILE (TOWEL DISPOSABLE) ×2 IMPLANT
TOWEL GREEN STERILE FF (TOWEL DISPOSABLE) ×2 IMPLANT

## 2021-06-27 NOTE — Interval H&P Note (Signed)
History and Physical Interval Note:  06/27/2021 6:55 AM  [name]  has presented today for surgery, with the diagnosis of LEFT BREAST CANCER.  The various methods of treatment have been discussed with the patient and family. After consideration of risks, benefits and other options for treatment, the patient has consented to  Procedure(s): LEFT BREAST LUMPECTOMY WITH RADIOACTIVE SEED LOCALIZATION (Left) as a surgical intervention.  The patient's history has been reviewed, patient examined, no change in status, stable for surgery.  I have reviewed the patient's chart and labs.  Questions were answered to the patient's satisfaction.     Rolm Bookbinder

## 2021-06-27 NOTE — Anesthesia Procedure Notes (Signed)
Procedure Name: LMA Insertion Date/Time: 06/27/2021 7:27 AM Performed by: Ardyth Harps, CRNA Pre-anesthesia Checklist: Patient identified, Emergency Drugs available, Suction available and Patient being monitored Patient Re-evaluated:Patient Re-evaluated prior to induction Oxygen Delivery Method: Circle System Utilized Preoxygenation: Pre-oxygenation with 100% oxygen Induction Type: IV induction Ventilation: Mask ventilation without difficulty LMA: LMA inserted LMA Size: 4.0 Number of attempts: 1 Airway Equipment and Method: Bite block Placement Confirmation: positive ETCO2 Tube secured with: Tape Dental Injury: Teeth and Oropharynx as per pre-operative assessment

## 2021-06-27 NOTE — Anesthesia Postprocedure Evaluation (Signed)
Anesthesia Post Note  Patient: Jillian Tyler  Procedure(s) Performed: LEFT BREAST LUMPECTOMY WITH RADIOACTIVE SEED LOCALIZATION (Left: Breast)     Patient location during evaluation: PACU Anesthesia Type: General Level of consciousness: awake and alert Pain management: pain level controlled Vital Signs Assessment: post-procedure vital signs reviewed and stable Respiratory status: spontaneous breathing, nonlabored ventilation and respiratory function stable Cardiovascular status: blood pressure returned to baseline and stable Postop Assessment: no apparent nausea or vomiting Anesthetic complications: no   No notable events documented.  Last Vitals:  Vitals:   06/27/21 0808 06/27/21 0820  BP:  123/62  Pulse:    Resp:    Temp: (!) 36.3 C   SpO2:  93%    Last Pain:  Vitals:   06/27/21 0830  TempSrc:   PainSc: 0-No pain                 Merlinda Frederick

## 2021-06-27 NOTE — H&P (Signed)
75 y.o. female who is seen today as an office consultation at the request of Dr. Moreen Fowler for evaluation of Breast Cancer   She is otherwise fairly healthy and presents after undergoing a screening mammogram that showed the density breast. There was a possible mass in the left breast noted. She returned for diagnostic views. These showed a very medial breast mass measuring 1.1 cm. Ultrasound at 10:00 5 cm from the nipple showed a 1.1 cm mass. Ultrasound of the left axilla demonstrated multiple normal-appearing nodes. Ultrasound-guided biopsy with clip placement was then performed. The pathology results showed this to be a grade 1-2 invasive lobular carcinoma that is 100% ER and PR positive, HER2 negative, and Ki-67 is 5%. She has no mass or discharge. She is comes in to discuss her options.  Review of Systems: A complete review of systems was obtained from the patient. I have reviewed this information and discussed as appropriate with the patient. See HPI as well for other ROS.  Review of Systems  All other systems reviewed and are negative.  Medical History: Past Medical History:  Diagnosis Date   History of cancer   History reviewed. No pertinent surgical history.   Allergies  Allergen Reactions   Latex Other (See Comments)   Amoxicillin-Pot Clavulanate Diarrhea and Nausea And Vomiting   Current Outpatient Medications on File Prior to Visit  Medication Sig Dispense Refill   progesterone (PROMETRIUM) 200 MG capsule TAKE 1 CAPSULE BY MOUTH AT BEDTIME FOR 14 DAYS EVERY 3 MONTHS   traZODone (DESYREL) 100 MG tablet    History reviewed. No pertinent family history.   Social History   Tobacco Use  Smoking Status Current Every Day Smoker   Types: Cigarettes  Smokeless Tobacco Never Used    Social History   Socioeconomic History   Marital status: Married  Tobacco Use   Smoking status: Current Every Day Smoker  Types: Cigarettes   Smokeless tobacco: Never Used  Brewing technologist Use: Never used  Substance and Sexual Activity   Alcohol use: Never   Drug use: Never   Objective:   Vitals:  06/03/21 1022  BP: 120/60  Pulse: 74  Temp: 36.6 C (97.8 F)  SpO2: 95%  Weight: 49.3 kg (108 lb 9.6 oz)  Height: 162.6 cm ($RemoveB'5\' 4"'LjKAJROg$ )   Body mass index is 18.64 kg/m.  Physical Exam Constitutional:  Appearance: Normal appearance.  Chest:  Breasts:  Right: No inverted nipple, mass or nipple discharge.  Left: No inverted nipple, mass or nipple discharge.  Lymphadenopathy:  Upper Body:  Right upper body: No supraclavicular or axillary adenopathy.  Left upper body: No supraclavicular or axillary adenopathy.  Neurological:  Mental Status: She is alert.   Assessment and Plan:   Malignant neoplasm of upper-inner quadrant of left breast in female, estrogen receptor positive (CMS-HCC)  Left breast seed guided lumpectomy  We discussed the staging and pathophysiology of breast cancer. We discussed all of the different options for treatment for breast cancer including surgery, chemotherapy, radiation therapy, Herceptin, and antiestrogen therapy. Based on the choosing wisely guidelines and her tumor I think it would be reasonable to omit a sentinel node biopsy we discussed that today and she is agreeable. She has a work-up for a pulmonary nodule pending but this will take precedence.  We discussed the options for treatment of the breast cancer which included lumpectomy versus a mastectomy. We discussed the performance of the lumpectomy with radioactive seed placement. We discussed a 5-10% chance of  a positive margin requiring reexcision in the operating room. We also discussed that she will likely need radiation therapy if she undergoes lumpectomy. We discussed mastectomy and the postoperative care for that as well. Mastectomy can be followed by reconstruction. The decision for lumpectomy vs mastectomy has no impact on decision for chemotherapy. Most mastectomy patients will  not need radiation therapy. We discussed that there is no difference in her survival whether she undergoes lumpectomy with radiation therapy or antiestrogen therapy versus a mastectomy. There is also no real difference between her recurrence in the breast. We discussed the risks of operation including bleeding, infection, possible reoperation. She understands her further therapy will be based on what her stages at the time of her operation.

## 2021-06-27 NOTE — Transfer of Care (Signed)
Immediate Anesthesia Transfer of Care Note  Patient: Jillian Tyler  Procedure(s) Performed: LEFT BREAST LUMPECTOMY WITH RADIOACTIVE SEED LOCALIZATION (Left: Breast)  Patient Location: PACU  Anesthesia Type:General  Level of Consciousness: awake and alert   Airway & Oxygen Therapy: Patient Spontanous Breathing  Post-op Assessment: Report given to RN and Post -op Vital signs reviewed and stable  Post vital signs: Reviewed and stable  Last Vitals:  Vitals Value Taken Time  BP    Temp    Pulse 76 06/27/21 0808  Resp 11 06/27/21 0808  SpO2 92 % 06/27/21 0808  Vitals shown include unvalidated device data.  Last Pain:  Vitals:   06/27/21 0605  TempSrc:   PainSc: 0-No pain         Complications: No notable events documented.

## 2021-06-27 NOTE — Discharge Instructions (Signed)
[facility] Office Phone Number [phone]  BREAST BIOPSY/ PARTIAL MASTECTOMY: POST OP INSTRUCTIONS Take 400 mg of ibuprofen every 8 hours or 650 mg tylenol every 6 hours for next 72 hours then as needed. Use ice several times daily also. Always review your discharge instruction sheet given to you by the facility where your surgery was performed.  IF YOU HAVE DISABILITY OR FAMILY LEAVE FORMS, YOU MUST BRING THEM TO THE OFFICE FOR PROCESSING.  DO NOT GIVE THEM TO YOUR DOCTOR.  A prescription for pain medication may be given to you upon discharge.  Take your pain medication as prescribed, if needed.  If narcotic pain medicine is not needed, then you may take acetaminophen (Tylenol), naprosyn (Alleve) or ibuprofen (Advil) as needed. Take your usually prescribed medications unless otherwise directed If you need a refill on your pain medication, please contact your pharmacy.  They will contact our office to request authorization.  Prescriptions will not be filled after 5pm or on week-ends. You should eat very light the first 24 hours after surgery, such as soup, crackers, pudding, etc.  Resume your normal diet the day after surgery. Most patients will experience some swelling and bruising in the breast.  Ice packs and a good support bra will help.  Wear the breast binder provided or a sports bra for 72 hours day and night.  After that wear a sports bra during the day until you return to the office. Swelling and bruising can take several days to resolve.  It is common to experience some constipation if taking pain medication after surgery.  Increasing fluid intake and taking a stool softener will usually help or prevent this problem from occurring.  A mild laxative (Milk of Magnesia or Miralax) should be taken according to package directions if there are no bowel movements after 48 hours. Unless discharge instructions indicate otherwise, you may remove your bandages 48 hours after surgery  and you may shower at that time.  You may have steri-strips (small skin tapes) in place directly over the incision.  These strips should be left on the skin for 7-10 days and will come off on their own.  If your surgeon used skin glue on the incision, you may shower in 24 hours.  The glue will flake off over the next 2-3 weeks.  Any sutures or staples will be removed at the office during your follow-up visit. ACTIVITIES:  You may resume regular daily activities (gradually increasing) beginning the next day.  Wearing a good support bra or sports bra minimizes pain and swelling.  You may have sexual intercourse when it is comfortable. You may drive when you no longer are taking prescription pain medication, you can comfortably wear a seatbelt, and you can safely maneuver your car and apply brakes. RETURN TO WORK:  ______________________________________________________________________________________ You should see your doctor in the office for a follow-up appointment approximately two weeks after your surgery.  Your doctor's nurse will typically make your follow-up appointment when she calls you with your pathology report.  Expect your pathology report 3-4 business days after your surgery.  You may call to check if you do not hear from us after three days. OTHER INSTRUCTIONS: _______________________________________________________________________________________________ _____________________________________________________________________________________________________________________________________ _____________________________________________________________________________________________________________________________________ _____________________________________________________________________________________________________________________________________  WHEN TO CALL DR Airelle Everding: Fever over 101.0 Nausea and/or vomiting. Extreme swelling or bruising. Continued bleeding from incision. Increased  pain, redness, or drainage from the incision.  The clinic staff is available to answer your questions during regular business hours.  Please don't hesitate to call   and ask to speak to one of the nurses for clinical concerns.  If you have a medical emergency, go to the nearest emergency room or call 911.  A surgeon from [facility] is always on call at the hospital.  For further questions, please visit [email or web address]  

## 2021-06-27 NOTE — Op Note (Signed)
Preoperative diagnosis: Left clinical stage I invasive lobular carcinoma Postoperative diagnosis: Same as above Procedure: Left breast radioactive seed guided lumpectomy Surgeon: Dr. Serita Grammes Anesthesia: General with a pectoral block Estimated blood loss: Minimal Specimens: left breast tissue containing seed and clip Complications: None Drains: None Special count was correct at completion Disposition to recovery in stable condition  Indication: 38 yof with a medial breast mass measuring 1.1 cm. Ultrasound at 10:00 5 cm from the nipple showed a 1.1 cm mass. Ultrasound of the left axilla demonstrated multiple normal-appearing nodes. Ultrasound-guided biopsy with clip placement was then performed. The pathology results showed this to be a grade 1-2 invasive lobular carcinoma that is 100% ER and PR positive, HER2 negative, and Ki-67 is 5%. We elected to proceed with lumpectomy alone.   Procedure: After informed consent was obtained the patient was taken to the operating room.  She was given antibiotics.  SCDs were placed.  She was placed under general anesthesia without complication.  She was prepped and draped in the standard sterile surgical fashion.  A surgical timeout was then performed.  This was in the lower upper quadrant.  I infiltrated Marcaine I made a curvilinear incision to include some of the skin.  I carried this down and identified the seed I remove the seed and the surrounding tissue with an attempt to get a clear margin using neoprobe guidance.  Mammogram confirmed removal of the seed and the clip.  The posterior margin is the muscle.  I then obtained hemostasis.  I closed this with 2-0 Vicryl, 3-0 Vicryl, and 4-0 Monocryl.  Glue and Steri-Strips were applied.  She tolerated this well was extubated and transferred to recovery stable.

## 2021-06-28 ENCOUNTER — Encounter (HOSPITAL_COMMUNITY): Payer: Self-pay | Admitting: General Surgery

## 2021-06-30 ENCOUNTER — Ambulatory Visit: Payer: PPO | Admitting: Internal Medicine

## 2021-07-01 ENCOUNTER — Encounter: Payer: Self-pay | Admitting: *Deleted

## 2021-07-05 ENCOUNTER — Ambulatory Visit: Payer: PPO | Admitting: Internal Medicine

## 2021-07-06 ENCOUNTER — Encounter: Payer: Self-pay | Admitting: *Deleted

## 2021-07-12 ENCOUNTER — Encounter: Payer: Self-pay | Admitting: *Deleted

## 2021-07-12 ENCOUNTER — Telehealth: Payer: Self-pay | Admitting: *Deleted

## 2021-07-12 DIAGNOSIS — Z170 Estrogen receptor positive status [ER+]: Secondary | ICD-10-CM

## 2021-07-12 DIAGNOSIS — C50212 Malignant neoplasm of upper-inner quadrant of left female breast: Secondary | ICD-10-CM

## 2021-07-12 LAB — SURGICAL PATHOLOGY

## 2021-07-12 NOTE — Telephone Encounter (Signed)
Left msg on verified vm pathology report per Dr. Donne Hazel. No further surgery. Informed oncotype did not need to be sent per Dr. Burr Medico as tumor is <1cm and GI. Informed next step is xrt with Dr. Lisbeth Renshaw. Referral placed,

## 2021-07-18 ENCOUNTER — Encounter: Payer: Self-pay | Admitting: *Deleted

## 2021-08-08 NOTE — Progress Notes (Signed)
Radiation Oncology         ([phone]) [phone] ________________________________  Name: Jillian Tyler        MRN: [id number]  Date of Service: 08/10/2021 DOB: 06-15-1946  YC:[id number], Jillian Brow, MD  Truitt Merle, MD     REFERRING PHYSICIAN: Truitt Merle, MD   DIAGNOSIS: The encounter diagnosis was Malignant neoplasm of upper-inner quadrant of left breast in female, estrogen receptor positive ([place]).   HISTORY OF PRESENT ILLNESS: Jillian Tyler is a 76 y.o. female seen for a diagnosis of left breast cancer. She noted to have  suspicious findings within the left breast on initial screening mammogram.  A diagnostic mammogram and breast ultrasound confirmed this finding. On ultrasound, the tumor measured 1.1 cm and was present in the upper/ outer quadrant. A biopsy revealed Invasive lobular carcinoma. Receptors indicate that the tumor is ER/PR positive, and Her-2/neu negative. The Ki-67 staining was 5%.S  Since her last visit, she had undergone left lumpectomy on 06/27/21 showing a 9 mm, grade 1 invasive lobular carcinoma with the posterior margin 1 mm from noninvasive disease. She is seen to discuss adjuvant radiotherapy.    PREVIOUS RADIATION THERAPY: No   PAST MEDICAL HISTORY:  Past Medical History:  Diagnosis Date   Allergy    Breast cancer ([place])        PAST SURGICAL HISTORY: Past Surgical History:  Procedure Laterality Date   BREAST CYST ASPIRATION Right    20 + yrs ago   BREAST LUMPECTOMY WITH RADIOACTIVE SEED LOCALIZATION Left 06/27/2021   Procedure: LEFT BREAST LUMPECTOMY WITH RADIOACTIVE SEED LOCALIZATION;  Surgeon: Rolm Bookbinder, MD;  Location: [facility];  Service: General;  Laterality: Left;     FAMILY HISTORY:  Family History  Problem Relation Age of Onset   Stroke Mother    Prostate cancer Father    Kidney cancer Brother    Lung disease Neg Hx      SOCIAL HISTORY:  reports that she has been smoking cigarettes. She started smoking about 51 years ago. She has a 10.00 pack-year  smoking history. She has never used smokeless tobacco. She reports that she does not drink alcohol and does not use drugs. The patient is married and lives in [place]. She helps care for her great grand children who live with her.   ALLERGIES: Hydrocod polst-cpm polst er, Influenza vaccines, Latex, and Augmentin [amoxicillin-pot clavulanate]   MEDICATIONS:  Current Outpatient Medications  Medication Sig Dispense Refill   acetaminophen (TYLENOL) 500 MG tablet Take 500 mg by mouth every 6 (six) hours as needed (headache).     Cholecalciferol (VITAMIN D3 PO) Take 1 tablet by mouth every evening.     fluticasone (FLONASE) 50 MCG/ACT nasal spray Place 1 spray into both nostrils daily as needed for allergies or rhinitis.     Multiple Vitamin (MULTIVITAMIN WITH MINERALS) TABS tablet Take 1 tablet by mouth every evening. Centrum Multivitamin     Omega-3 Fatty Acids (FISH OIL PO) Take 1 capsule by mouth daily after supper.     traZODone (DESYREL) 100 MG tablet Take 100 mg by mouth at bedtime.     No current facility-administered medications for this encounter.     REVIEW OF SYSTEMS: On review of systems, the patient reports that she is doing well. She's had a little soreness in her left upper breast but no redness, fevers, or progressive pain. No other complaints are noted.      PHYSICAL EXAM:  Wt Readings from Last 3 Encounters:  06/27/21 108  lb 8 oz (49.2 kg)  06/21/21 105 lb 14.4 oz (48 kg)  06/08/21 104 lb 6.4 oz (47.4 kg)   Temp Readings from Last 3 Encounters:  06/27/21 (!) 97.4 F (36.3 C)  06/21/21 98.1 F (36.7 C) (Oral)  06/08/21 97.7 F (36.5 C) (Oral)   BP Readings from Last 3 Encounters:  06/27/21 123/62  06/21/21 (!) 122/56  06/08/21 124/71   Pulse Readings from Last 3 Encounters:  06/27/21 61  06/21/21 66  06/08/21 69    In general this is a well appearing caucasian female in no acute distress. She's alert and oriented x4 and appropriate throughout the  examination. Cardiopulmonary assessment is negative for acute distress and she exhibits normal effort. The left breast incision site is well healed without erythema, cellulitic streaking, separation or drainage is noted.     ECOG = 1  0 - Asymptomatic (Fully active, able to carry on all predisease activities without restriction)  1 - Symptomatic but completely ambulatory (Restricted in physically strenuous activity but ambulatory and able to carry out work of a light or sedentary nature. For example, light housework, office work)  2 - Symptomatic, <50% in bed during the day (Ambulatory and capable of all self care but unable to carry out any work activities. Up and about more than 50% of waking hours)  3 - Symptomatic, >50% in bed, but not bedbound (Capable of only limited self-care, confined to bed or chair 50% or more of waking hours)  4 - Bedbound (Completely disabled. Cannot carry on any self-care. Totally confined to bed or chair)  5 - Death   Eustace Pen MM, Creech RH, Tormey DC, et al. [phone]). "Toxicity and response criteria of the [facility]". [place] Oncol. 5 (6): 649-55    LABORATORY DATA:  Lab Results  Component Value Date   WBC 8.1 06/21/2021   HGB 12.9 06/21/2021   HCT 38.8 06/21/2021   MCV 94.9 06/21/2021   PLT 278 06/21/2021   No results found for: NA, K, CL, CO2 No results found for: ALT, AST, GGT, ALKPHOS, BILITOT    RADIOGRAPHY: No results found.     IMPRESSION/PLAN: 1. Stage IA, pT1b, cN0M0, grade 1 invasive lobular carcinoma of the left breast. Dr. Lisbeth Renshaw discusses the pathology findings and reviews the nature of early stage left breast disease. She has done well with lumpectomy and we reviewed the rationale for external radiotherapy to the breast  to reduce risks of local recurrence followed by antiestrogen therapy. We also discussed scenarios for which patients can forgo radiotherapy, however with her close posterior margin, Dr. Lisbeth Renshaw  recommends therapy. We discussed the risks, benefits, short, and long term effects of radiotherapy, as well as the curative intent, and the patient is interested in proceeding. Dr. Lisbeth Renshaw discusses the delivery and logistics of radiotherapy and recommends 4 weeks of radiotherapy to the left breast with deep inspiration breath hold technique. Written consent is obtained and placed in the chart, a copy was provided to the patient. She will simulate today.\   In a visit lasting 45 minutes, greater than 50% of the time was spent face to face reviewing her case, as well as in preparation of, discussing, and coordinating the patient's care.  The above documentation reflects my direct findings during this shared patient visit. Please see the separate note by Dr. Lisbeth Renshaw on this date for the remainder of the patient's plan of care.    Carola Rhine, [facility]    **Disclaimer: This  note was dictated with voice recognition software. Similar sounding words can inadvertently be transcribed and this note may contain transcription errors which may not have been corrected upon publication of note.**

## 2021-08-09 ENCOUNTER — Telehealth: Payer: Self-pay

## 2021-08-09 NOTE — Telephone Encounter (Signed)
Spoke w/ patient and verified identity. Reminded patient of 9:00am -08/10/21 "in-person" appointment w/ Shona Simpson, PA-C. Advised patient to arrive 26min early for check-in. I left my extension # [phone] in case patient needs to call. Patient verbalized understanding.

## 2021-08-10 ENCOUNTER — Ambulatory Visit
Admission: RE | Admit: 2021-08-10 | Discharge: 2021-08-10 | Disposition: A | Discharge status: Home / Self Care | Payer: PPO | Source: Ambulatory Visit | Attending: Radiation Oncology | Admitting: Radiation Oncology

## 2021-08-10 ENCOUNTER — Other Ambulatory Visit: Payer: Self-pay

## 2021-08-10 ENCOUNTER — Encounter: Payer: Self-pay | Admitting: Radiation Oncology

## 2021-08-10 VITALS — BP 112/57 | HR 65 | Temp 97.6°F | Resp 20 | Ht 64.0 in | Wt 99.2 lb

## 2021-08-10 DIAGNOSIS — Z170 Estrogen receptor positive status [ER+]: Secondary | ICD-10-CM | POA: Diagnosis not present

## 2021-08-10 DIAGNOSIS — Z510 Encounter for antineoplastic radiation therapy: Secondary | ICD-10-CM | POA: Insufficient documentation

## 2021-08-10 DIAGNOSIS — C50212 Malignant neoplasm of upper-inner quadrant of left female breast: Secondary | ICD-10-CM | POA: Diagnosis not present

## 2021-08-10 DIAGNOSIS — Z79899 Other long term (current) drug therapy: Secondary | ICD-10-CM | POA: Diagnosis not present

## 2021-08-10 DIAGNOSIS — F17210 Nicotine dependence, cigarettes, uncomplicated: Secondary | ICD-10-CM | POA: Insufficient documentation

## 2021-08-10 NOTE — Progress Notes (Signed)
Verified patient identity and begin nursing interview. Patient reports LT breast tenderness 5/10 (superior region). No other symptoms reported at this time.  Meaningful use complete. Postmenopausal- Patient states "NO chances of pregnancy."  BP (!) 112/57 (BP Location: Left Arm, Patient Position: Sitting, Cuff Size: Normal)    Pulse 65    Temp 97.6 F (36.4 C) (Temporal)    Resp 20    Ht 5\' 4"  (1.626 m)    Wt 99 lb 3.2 oz (45 kg)    SpO2 96%    BMI 17.03 kg/m

## 2021-08-16 DIAGNOSIS — Z170 Estrogen receptor positive status [ER+]: Secondary | ICD-10-CM | POA: Diagnosis not present

## 2021-08-16 DIAGNOSIS — C50212 Malignant neoplasm of upper-inner quadrant of left female breast: Secondary | ICD-10-CM | POA: Diagnosis not present

## 2021-08-16 DIAGNOSIS — Z510 Encounter for antineoplastic radiation therapy: Secondary | ICD-10-CM | POA: Diagnosis not present

## 2021-08-17 ENCOUNTER — Ambulatory Visit
Admission: RE | Admit: 2021-08-17 | Discharge: 2021-08-17 | Disposition: A | Discharge status: Home / Self Care | Payer: PPO | Source: Ambulatory Visit | Attending: Radiation Oncology | Admitting: Radiation Oncology

## 2021-08-17 ENCOUNTER — Encounter: Payer: Self-pay | Admitting: *Deleted

## 2021-08-17 ENCOUNTER — Other Ambulatory Visit: Payer: Self-pay

## 2021-08-17 DIAGNOSIS — Z510 Encounter for antineoplastic radiation therapy: Secondary | ICD-10-CM | POA: Diagnosis not present

## 2021-08-17 DIAGNOSIS — Z170 Estrogen receptor positive status [ER+]: Secondary | ICD-10-CM | POA: Diagnosis not present

## 2021-08-17 DIAGNOSIS — C50212 Malignant neoplasm of upper-inner quadrant of left female breast: Secondary | ICD-10-CM | POA: Diagnosis not present

## 2021-08-18 ENCOUNTER — Ambulatory Visit
Admission: RE | Admit: 2021-08-18 | Discharge: 2021-08-18 | Disposition: A | Discharge status: Home / Self Care | Payer: PPO | Source: Ambulatory Visit | Attending: Radiation Oncology | Admitting: Radiation Oncology

## 2021-08-18 DIAGNOSIS — C50212 Malignant neoplasm of upper-inner quadrant of left female breast: Secondary | ICD-10-CM | POA: Diagnosis not present

## 2021-08-18 DIAGNOSIS — Z170 Estrogen receptor positive status [ER+]: Secondary | ICD-10-CM | POA: Diagnosis not present

## 2021-08-18 DIAGNOSIS — Z510 Encounter for antineoplastic radiation therapy: Secondary | ICD-10-CM | POA: Diagnosis not present

## 2021-08-18 NOTE — Progress Notes (Signed)
Pt here for patient teaching.  Pt given Radiation and You booklet, skin care instructions, Alra deodorant, and Radiaplex gel.  Reviewed areas of pertinence such as fatigue, hair loss, skin changes, breast tenderness, and breast swelling . Pt able to give teach back of to pat skin and use unscented/gentle soap,apply Radiaplex bid, avoid applying anything to skin within 4 hours of treatment, avoid wearing an under wire bra, and to use an electric razor if they must shave. Pt verbalizes understanding of information given and will contact nursing with any questions or concerns.    Kden Wagster M. Hamid Brookens RN, BSN      

## 2021-08-19 ENCOUNTER — Ambulatory Visit
Admission: RE | Admit: 2021-08-19 | Discharge: 2021-08-19 | Disposition: A | Discharge status: Home / Self Care | Payer: PPO | Source: Ambulatory Visit | Attending: Radiation Oncology | Admitting: Radiation Oncology

## 2021-08-19 ENCOUNTER — Other Ambulatory Visit: Payer: Self-pay

## 2021-08-19 DIAGNOSIS — Z510 Encounter for antineoplastic radiation therapy: Secondary | ICD-10-CM | POA: Diagnosis not present

## 2021-08-19 DIAGNOSIS — C50212 Malignant neoplasm of upper-inner quadrant of left female breast: Secondary | ICD-10-CM | POA: Diagnosis not present

## 2021-08-19 DIAGNOSIS — Z170 Estrogen receptor positive status [ER+]: Secondary | ICD-10-CM | POA: Diagnosis not present

## 2021-08-19 MED ORDER — ALRA NON-METALLIC DEODORANT (RAD-ONC)
1.0000 "application " | Freq: Once | TOPICAL | Status: AC
  Administered 2021-08-19: 1 via TOPICAL

## 2021-08-19 MED ORDER — RADIAPLEXRX EX GEL: Freq: Once | CUTANEOUS | Status: DC

## 2021-08-19 MED ORDER — ALRA NON-METALLIC DEODORANT (RAD-ONC): 1.0000 "application " | Freq: Once | TOPICAL | Status: DC

## 2021-08-19 MED ORDER — RADIAPLEXRX EX GEL: Freq: Once | CUTANEOUS | Status: AC

## 2021-08-22 ENCOUNTER — Other Ambulatory Visit: Payer: Self-pay

## 2021-08-22 ENCOUNTER — Ambulatory Visit
Admission: RE | Admit: 2021-08-22 | Discharge: 2021-08-22 | Disposition: A | Discharge status: Home / Self Care | Payer: PPO | Source: Ambulatory Visit | Attending: Radiation Oncology | Admitting: Radiation Oncology

## 2021-08-22 DIAGNOSIS — Z510 Encounter for antineoplastic radiation therapy: Secondary | ICD-10-CM | POA: Diagnosis not present

## 2021-08-22 DIAGNOSIS — Z170 Estrogen receptor positive status [ER+]: Secondary | ICD-10-CM | POA: Diagnosis not present

## 2021-08-22 DIAGNOSIS — C50212 Malignant neoplasm of upper-inner quadrant of left female breast: Secondary | ICD-10-CM | POA: Diagnosis not present

## 2021-08-23 ENCOUNTER — Ambulatory Visit
Admission: RE | Admit: 2021-08-23 | Discharge: 2021-08-23 | Disposition: A | Discharge status: Home / Self Care | Payer: PPO | Source: Ambulatory Visit | Attending: Radiation Oncology | Admitting: Radiation Oncology

## 2021-08-23 ENCOUNTER — Other Ambulatory Visit: Payer: Self-pay

## 2021-08-23 DIAGNOSIS — C50212 Malignant neoplasm of upper-inner quadrant of left female breast: Secondary | ICD-10-CM | POA: Diagnosis not present

## 2021-08-23 DIAGNOSIS — Z510 Encounter for antineoplastic radiation therapy: Secondary | ICD-10-CM | POA: Diagnosis not present

## 2021-08-23 DIAGNOSIS — Z170 Estrogen receptor positive status [ER+]: Secondary | ICD-10-CM | POA: Diagnosis not present

## 2021-08-24 ENCOUNTER — Ambulatory Visit: Admission: RE | Admit: 2021-08-24 | Payer: PPO | Source: Ambulatory Visit

## 2021-08-25 ENCOUNTER — Other Ambulatory Visit: Payer: Self-pay

## 2021-08-25 ENCOUNTER — Ambulatory Visit
Admission: RE | Admit: 2021-08-25 | Discharge: 2021-08-25 | Disposition: A | Discharge status: Home / Self Care | Payer: PPO | Source: Ambulatory Visit | Attending: Radiation Oncology | Admitting: Radiation Oncology

## 2021-08-25 DIAGNOSIS — Z510 Encounter for antineoplastic radiation therapy: Secondary | ICD-10-CM | POA: Diagnosis not present

## 2021-08-25 DIAGNOSIS — Z170 Estrogen receptor positive status [ER+]: Secondary | ICD-10-CM | POA: Diagnosis not present

## 2021-08-25 DIAGNOSIS — C50212 Malignant neoplasm of upper-inner quadrant of left female breast: Secondary | ICD-10-CM | POA: Diagnosis not present

## 2021-08-26 ENCOUNTER — Ambulatory Visit
Admission: RE | Admit: 2021-08-26 | Discharge: 2021-08-26 | Disposition: A | Discharge status: Home / Self Care | Payer: PPO | Source: Ambulatory Visit | Attending: Radiation Oncology | Admitting: Radiation Oncology

## 2021-08-26 ENCOUNTER — Encounter: Payer: Self-pay | Admitting: Radiation Oncology

## 2021-08-26 DIAGNOSIS — Z170 Estrogen receptor positive status [ER+]: Secondary | ICD-10-CM | POA: Diagnosis not present

## 2021-08-26 DIAGNOSIS — Z510 Encounter for antineoplastic radiation therapy: Secondary | ICD-10-CM | POA: Diagnosis not present

## 2021-08-26 DIAGNOSIS — C50212 Malignant neoplasm of upper-inner quadrant of left female breast: Secondary | ICD-10-CM | POA: Diagnosis not present

## 2021-08-29 ENCOUNTER — Ambulatory Visit
Admission: RE | Admit: 2021-08-29 | Discharge: 2021-08-29 | Disposition: A | Discharge status: Home / Self Care | Payer: PPO | Source: Ambulatory Visit | Attending: Radiation Oncology | Admitting: Radiation Oncology

## 2021-08-29 ENCOUNTER — Other Ambulatory Visit: Payer: Self-pay

## 2021-08-29 DIAGNOSIS — C50212 Malignant neoplasm of upper-inner quadrant of left female breast: Secondary | ICD-10-CM | POA: Diagnosis not present

## 2021-08-29 DIAGNOSIS — Z170 Estrogen receptor positive status [ER+]: Secondary | ICD-10-CM | POA: Diagnosis not present

## 2021-08-29 DIAGNOSIS — Z510 Encounter for antineoplastic radiation therapy: Secondary | ICD-10-CM | POA: Diagnosis not present

## 2021-08-30 ENCOUNTER — Ambulatory Visit
Admission: RE | Admit: 2021-08-30 | Discharge: 2021-08-30 | Disposition: A | Discharge status: Home / Self Care | Payer: PPO | Source: Ambulatory Visit | Attending: Radiation Oncology | Admitting: Radiation Oncology

## 2021-08-30 DIAGNOSIS — Z170 Estrogen receptor positive status [ER+]: Secondary | ICD-10-CM | POA: Diagnosis not present

## 2021-08-30 DIAGNOSIS — C50212 Malignant neoplasm of upper-inner quadrant of left female breast: Secondary | ICD-10-CM | POA: Diagnosis not present

## 2021-08-30 DIAGNOSIS — Z510 Encounter for antineoplastic radiation therapy: Secondary | ICD-10-CM | POA: Diagnosis not present

## 2021-08-30 NOTE — Progress Notes (Signed)
°  Radiation Oncology         [phone] ________________________________  Name: Jillian Tyler MRN: [id number]  Date: 08/26/2021  DOB: Dec 22, 1945  SIMULATION NOTE   NARRATIVE:  The patient underwent simulation today for ongoing radiation therapy.  The existing CT study set was employed for the purpose of virtual treatment planning.  The target and avoidance structures were reviewed and modified as necessary.  Treatment planning then occurred.  The radiation boost prescription was entered and confirmed.  A total of 1 complex treatment devices were fabricated in the form of an en face electron block/ field to shape radiation around the targets while maximally excluding nearby normal structures. I have requested : Isodose Plan with 6 MeV electrons.    PLAN:  This modified radiation beam arrangement is intended to continue the current radiation dose to an additional 8 Gy in 4 fractions for a total cumulative dose of 50.56 Gy.    ------------------------------------------------  Jodelle Gross, MD, PhD

## 2021-08-31 ENCOUNTER — Other Ambulatory Visit: Payer: Self-pay

## 2021-08-31 ENCOUNTER — Ambulatory Visit
Admission: RE | Admit: 2021-08-31 | Discharge: 2021-08-31 | Disposition: A | Discharge status: Home / Self Care | Payer: PPO | Source: Ambulatory Visit | Attending: Radiation Oncology | Admitting: Radiation Oncology

## 2021-08-31 DIAGNOSIS — C50212 Malignant neoplasm of upper-inner quadrant of left female breast: Secondary | ICD-10-CM | POA: Diagnosis not present

## 2021-08-31 DIAGNOSIS — Z170 Estrogen receptor positive status [ER+]: Secondary | ICD-10-CM | POA: Diagnosis not present

## 2021-09-01 ENCOUNTER — Ambulatory Visit
Admission: RE | Admit: 2021-09-01 | Discharge: 2021-09-01 | Disposition: A | Discharge status: Home / Self Care | Payer: PPO | Source: Ambulatory Visit | Attending: Radiation Oncology | Admitting: Radiation Oncology

## 2021-09-01 ENCOUNTER — Other Ambulatory Visit: Payer: Self-pay

## 2021-09-01 DIAGNOSIS — Z170 Estrogen receptor positive status [ER+]: Secondary | ICD-10-CM | POA: Diagnosis not present

## 2021-09-01 DIAGNOSIS — C50212 Malignant neoplasm of upper-inner quadrant of left female breast: Secondary | ICD-10-CM | POA: Diagnosis not present

## 2021-09-02 ENCOUNTER — Ambulatory Visit
Admission: RE | Admit: 2021-09-02 | Discharge: 2021-09-02 | Disposition: A | Discharge status: Home / Self Care | Payer: PPO | Source: Ambulatory Visit | Attending: Radiation Oncology | Admitting: Radiation Oncology

## 2021-09-02 ENCOUNTER — Ambulatory Visit: Payer: PPO | Admitting: Radiation Oncology

## 2021-09-02 ENCOUNTER — Other Ambulatory Visit: Payer: Self-pay

## 2021-09-02 ENCOUNTER — Telehealth: Payer: Self-pay | Admitting: Hematology

## 2021-09-02 DIAGNOSIS — C50212 Malignant neoplasm of upper-inner quadrant of left female breast: Secondary | ICD-10-CM | POA: Diagnosis not present

## 2021-09-02 DIAGNOSIS — Z170 Estrogen receptor positive status [ER+]: Secondary | ICD-10-CM | POA: Diagnosis not present

## 2021-09-02 NOTE — Telephone Encounter (Signed)
Sch per 2/3, pt aware

## 2021-09-05 ENCOUNTER — Other Ambulatory Visit: Payer: Self-pay

## 2021-09-05 ENCOUNTER — Ambulatory Visit
Admission: RE | Admit: 2021-09-05 | Discharge: 2021-09-05 | Disposition: A | Discharge status: Home / Self Care | Payer: PPO | Source: Ambulatory Visit | Attending: Radiation Oncology | Admitting: Radiation Oncology

## 2021-09-05 DIAGNOSIS — C50212 Malignant neoplasm of upper-inner quadrant of left female breast: Secondary | ICD-10-CM | POA: Diagnosis not present

## 2021-09-05 DIAGNOSIS — Z170 Estrogen receptor positive status [ER+]: Secondary | ICD-10-CM | POA: Diagnosis not present

## 2021-09-06 ENCOUNTER — Ambulatory Visit
Admission: RE | Admit: 2021-09-06 | Discharge: 2021-09-06 | Disposition: A | Discharge status: Home / Self Care | Payer: PPO | Source: Ambulatory Visit | Attending: Radiation Oncology | Admitting: Radiation Oncology

## 2021-09-06 ENCOUNTER — Other Ambulatory Visit: Payer: Self-pay

## 2021-09-06 DIAGNOSIS — Z170 Estrogen receptor positive status [ER+]: Secondary | ICD-10-CM | POA: Diagnosis not present

## 2021-09-06 DIAGNOSIS — C50212 Malignant neoplasm of upper-inner quadrant of left female breast: Secondary | ICD-10-CM | POA: Diagnosis not present

## 2021-09-07 ENCOUNTER — Other Ambulatory Visit: Payer: Self-pay

## 2021-09-07 ENCOUNTER — Ambulatory Visit
Admission: RE | Admit: 2021-09-07 | Discharge: 2021-09-07 | Disposition: A | Discharge status: Home / Self Care | Payer: PPO | Source: Ambulatory Visit | Attending: Radiation Oncology | Admitting: Radiation Oncology

## 2021-09-07 DIAGNOSIS — Z170 Estrogen receptor positive status [ER+]: Secondary | ICD-10-CM | POA: Diagnosis not present

## 2021-09-07 DIAGNOSIS — C50212 Malignant neoplasm of upper-inner quadrant of left female breast: Secondary | ICD-10-CM | POA: Diagnosis not present

## 2021-09-08 ENCOUNTER — Other Ambulatory Visit: Payer: Self-pay

## 2021-09-08 ENCOUNTER — Ambulatory Visit: Payer: PPO | Admitting: Radiation Oncology

## 2021-09-08 ENCOUNTER — Ambulatory Visit
Admission: RE | Admit: 2021-09-08 | Discharge: 2021-09-08 | Disposition: A | Discharge status: Home / Self Care | Payer: PPO | Source: Ambulatory Visit | Attending: Radiation Oncology | Admitting: Radiation Oncology

## 2021-09-08 DIAGNOSIS — Z170 Estrogen receptor positive status [ER+]: Secondary | ICD-10-CM | POA: Diagnosis not present

## 2021-09-08 DIAGNOSIS — C50212 Malignant neoplasm of upper-inner quadrant of left female breast: Secondary | ICD-10-CM | POA: Diagnosis not present

## 2021-09-09 ENCOUNTER — Ambulatory Visit: Payer: PPO

## 2021-09-09 ENCOUNTER — Ambulatory Visit
Admission: RE | Admit: 2021-09-09 | Discharge: 2021-09-09 | Disposition: A | Discharge status: Home / Self Care | Payer: PPO | Source: Ambulatory Visit | Attending: Radiation Oncology | Admitting: Radiation Oncology

## 2021-09-09 DIAGNOSIS — C50212 Malignant neoplasm of upper-inner quadrant of left female breast: Secondary | ICD-10-CM | POA: Diagnosis not present

## 2021-09-12 ENCOUNTER — Encounter: Payer: Self-pay | Admitting: *Deleted

## 2021-09-12 ENCOUNTER — Ambulatory Visit
Admission: RE | Admit: 2021-09-12 | Discharge: 2021-09-12 | Disposition: A | Discharge status: Home / Self Care | Payer: PPO | Source: Ambulatory Visit | Attending: Radiation Oncology | Admitting: Radiation Oncology

## 2021-09-12 ENCOUNTER — Other Ambulatory Visit: Payer: Self-pay

## 2021-09-12 ENCOUNTER — Inpatient Hospital Stay (HOSPITAL_BASED_OUTPATIENT_CLINIC_OR_DEPARTMENT_OTHER): Payer: PPO | Admitting: Hematology

## 2021-09-12 ENCOUNTER — Encounter: Payer: Self-pay | Admitting: Hematology

## 2021-09-12 ENCOUNTER — Inpatient Hospital Stay: Payer: PPO

## 2021-09-12 VITALS — BP 133/61 | HR 68 | Temp 98.0°F | Resp 17 | Ht 64.0 in | Wt 100.2 lb

## 2021-09-12 DIAGNOSIS — C50212 Malignant neoplasm of upper-inner quadrant of left female breast: Secondary | ICD-10-CM | POA: Insufficient documentation

## 2021-09-12 DIAGNOSIS — Z170 Estrogen receptor positive status [ER+]: Secondary | ICD-10-CM

## 2021-09-12 LAB — CBC WITH DIFFERENTIAL/PLATELET
Abs Immature Granulocytes: 0.01 10*3/uL (ref 0.00–0.07)
Basophils Absolute: 0 10*3/uL (ref 0.0–0.1)
Basophils Relative: 1 %
Eosinophils Absolute: 0.2 10*3/uL (ref 0.0–0.5)
Eosinophils Relative: 2 %
HCT: 37.2 % (ref 36.0–46.0)
Hemoglobin: 12.7 g/dL (ref 12.0–15.0)
Immature Granulocytes: 0 %
Lymphocytes Relative: 15 %
Lymphs Abs: 1.3 10*3/uL (ref 0.7–4.0)
MCH: 31 pg (ref 26.0–34.0)
MCHC: 34.1 g/dL (ref 30.0–36.0)
MCV: 90.7 fL (ref 80.0–100.0)
Monocytes Absolute: 0.9 10*3/uL (ref 0.1–1.0)
Monocytes Relative: 11 %
Neutro Abs: 5.9 10*3/uL (ref 1.7–7.7)
Neutrophils Relative %: 71 %
Platelets: 260 10*3/uL (ref 150–400)
RBC: 4.1 MIL/uL (ref 3.87–5.11)
RDW: 12.2 % (ref 11.5–15.5)
WBC: 8.3 10*3/uL (ref 4.0–10.5)
nRBC: 0 % (ref 0.0–0.2)

## 2021-09-12 LAB — COMPREHENSIVE METABOLIC PANEL
ALT: 15 U/L (ref 0–44)
AST: 18 U/L (ref 15–41)
Albumin: 4.3 g/dL (ref 3.5–5.0)
Alkaline Phosphatase: 34 U/L — ABNORMAL LOW (ref 38–126)
Anion gap: 5 (ref 5–15)
BUN: 13 mg/dL (ref 8–23)
CO2: 32 mmol/L (ref 22–32)
Calcium: 9.8 mg/dL (ref 8.9–10.3)
Chloride: 102 mmol/L (ref 98–111)
Creatinine, Ser: 0.74 mg/dL (ref 0.44–1.00)
GFR, Estimated: >60 mL/min (ref >60)
Glucose, Bld: 101 mg/dL — ABNORMAL HIGH (ref 70–99)
Potassium: 3.9 mmol/L (ref 3.5–5.1)
Sodium: 139 mmol/L (ref 135–145)
Total Bilirubin: 0.6 mg/dL (ref 0.3–1.2)
Total Protein: 7.6 g/dL (ref 6.5–8.1)

## 2021-09-12 MED ORDER — EXEMESTANE 25 MG PO TABS: 25.0000 mg | ORAL_TABLET | Freq: Every day | ORAL | 3 refills | Status: DC

## 2021-09-12 NOTE — Progress Notes (Signed)
Jillian Tyler   [phone] [phone]   Clinic Follow up Note   Patient Care Team: Antony Contras, MD as PCP - General (Family Medicine) Rolm Bookbinder, MD as Consulting Physician (General Surgery) Kyung Rudd, MD as Consulting Physician (Radiation Oncology) Truitt Merle, MD as Consulting Physician (Hematology) Rockwell Germany, RN as Oncology Nurse Navigator Mauro Kaufmann, RN as Oncology Nurse Navigator  Date of Service:  09/12/2021  CHIEF COMPLAINT: f/u of left breast cancer  CURRENT THERAPY:  PENDING Exemestane  ASSESSMENT & PLAN:  Jillian Tyler is a 76 y.o. female with   1. Malignant neoplasm of upper-inner quadrant of left breast, invasive lobular carcinoma, stage IA, p(T1b, N0), ER+/PR+/HER2-, Grade 1-2 -found on screening mammogram. Biopsy 05/23/21 showed invasive and in situ lobular carcinoma, grade 1-2. -left lumpectomy on 06/27/21 with Dr. Donne Hazel showed 0.9 cm ILC, grade 1. Margins close but uninvolved. -she began radiation under Dr. Lisbeth Renshaw on 08/17/21. She will finish on Wednesday, 09/14/21. --Given the strong ER and PR positivity, I do recommend adjuvant aromatase inhibitor to reduce her risk of cancer recurrence,  The potential benefit and side effects, which includes but not limited to, hot flash, skin and vaginal dryness, metabolic changes ( increased blood glucose, cholesterol, weight, etc.), slightly in increased risk of cardiovascular disease, cataracts, muscular and joint discomfort, osteopenia and osteoporosis, etc, were discussed with her in great details. She is interested, and we'll start after she completes radiation. I prescribed exemestane and asked her to start in the beginning of March. -due to her lobular history, plan for AI 7 years if she tolerate well -We also discussed the breast cancer surveillance after her surgery. She will continue annual screening mammogram, self exam, and a routine office visit with lab and exam  with [place]. -I encouraged her to have healthy diet and exercise regularly.    2. Bone Health  -Her most recent DEXA was normal, per pt.     PLAN:  -proceed with lab today -continue daily radiation through 09/14/21 -start exemestane in early 09/2021 -survivorship in 3 months -lab and f/u in 6 months   No problem-specific Assessment & Plan notes found for this encounter.   SUMMARY OF ONCOLOGIC HISTORY: Oncology History Overview Note   Cancer Staging  Malignant neoplasm of upper-inner quadrant of left breast in female, estrogen receptor positive ([place]) Staging form: Breast, AJCC 8th Edition - Clinical stage from 05/23/2021: Stage IA (cT1c, cN0, cM0, G1, ER+, PR+, HER2-) - Signed by Truitt Merle, MD on 06/07/2021 - Pathologic stage from 06/27/2021: Stage Unknown (pT1b, pNX, cM0, G1, ER+, PR+, HER2-) - Signed by Truitt Merle, MD on 09/10/2021     Malignant neoplasm of upper-inner quadrant of left breast in female, estrogen receptor positive ([place])  05/13/2021 Mammogram   EXAM: DIGITAL DIAGNOSTIC UNILATERAL LEFT MAMMOGRAM WITH TOMOSYNTHESIS AND CAD;  ULTRASOUND LEFT BREAST LIMITED  IMPRESSION: 1. There is a highly suspicious 1.1 cm mass in the left breast at 10 o'clock.   2.  No evidence of left axillary lymphadenopathy.   05/23/2021 Cancer Staging   Staging form: Breast, AJCC 8th Edition - Clinical stage from 05/23/2021: Stage IA (cT1c, cN0, cM0, G1, ER+, PR+, HER2-) - Signed by Truitt Merle, MD on 06/07/2021 Stage prefix: Initial diagnosis Histologic grading system: 3 grade system    05/23/2021 Pathology Results   Diagnosis Breast, left, needle core biopsy, 10 o'clock 5 cmfn - INVASIVE MAMMARY CARCINOMA - MAMMARY CARCINOMA IN-SITU - SEE COMMENT Microscopic Comment The biopsy material shows  an infiltrative proliferation of cells with arranged linearly and in small clusters. Based on the biopsy, the carcinoma appears Nottingham grade 1-2 of 3 and measures 0.5 cm in greatest linear  extent.  E-cadherin is negative supporting lobular origin.  PROGNOSTIC INDICATORS Results: The tumor cells are EQUIVOCAL for Her2 (2+). Her2 by FISH will be performed and results reported separately. Estrogen Receptor: 100%, POSITIVE, STRONG STAINING INTENSITY Progesterone Receptor: 100%, POSITIVE, STRONG STAINING INTENSITY Proliferation Marker Ki67: 5%  FLUORESCENCE IN-SITU HYBRIDIZATION Results: GROUP 5: HER2 **NEGATIVE**   06/07/2021 Initial Diagnosis   Malignant neoplasm of upper-inner quadrant of left breast in female, estrogen receptor positive ([place])   06/27/2021 Cancer Staging   Staging form: Breast, AJCC 8th Edition - Pathologic stage from 06/27/2021: Stage Unknown (pT1b, pNX, cM0, G1, ER+, PR+, HER2-) - Signed by Truitt Merle, MD on 09/10/2021 Stage prefix: Initial diagnosis Multigene prognostic tests performed: None Histologic grading system: 3 grade system    06/27/2021 Definitive Surgery   FINAL MICROSCOPIC DIAGNOSIS:   A. BREAST, LEFT, LUMPECTOMY:  -  Invasive lobular carcinoma, Nottingham grade 1 of 3, 0.9 cm  -  Margins uninvolved by carcinoma (<0.1 cm; posterior margin)  -  Previous biopsy site changes present  -  See oncology table and comment below  Comment(s): Cytokeratin AE1/3 highlights the presence of invasive carcinoma in proximity to the inked posterior margin.       INTERVAL HISTORY:  Jillian Tyler is here for a follow up of breast cancer. She was last seen by me on 06/08/21. She presents to the clinic alone. She reports she is doing well overall. She states she is tolerating radiation well, but she does note an area of concern. She notes she has started to use some neosporin.   All other systems were reviewed with the patient and are negative.  MEDICAL HISTORY:  Past Medical History:  Diagnosis Date   Allergy    Breast cancer ([place])     SURGICAL HISTORY: Past Surgical History:  Procedure Laterality Date   BREAST CYST ASPIRATION Right    20 +  yrs ago   BREAST LUMPECTOMY WITH RADIOACTIVE SEED LOCALIZATION Left 06/27/2021   Procedure: LEFT BREAST LUMPECTOMY WITH RADIOACTIVE SEED LOCALIZATION;  Surgeon: Rolm Bookbinder, MD;  Location: [place];  Service: General;  Laterality: Left;    I have reviewed the social history and family history with the patient and they are unchanged from previous note.  ALLERGIES:  is allergic to hydrocod poli-chlorphe poli er, influenza vaccines, latex, and augmentin [amoxicillin-pot clavulanate].  MEDICATIONS:  Current Outpatient Medications  Medication Sig Dispense Refill   exemestane (AROMASIN) 25 MG tablet Take 1 tablet (25 mg total) by mouth daily after breakfast. 30 tablet 3   acetaminophen (TYLENOL) 500 MG tablet Take 500 mg by mouth every 6 (six) hours as needed (headache).     Cholecalciferol (VITAMIN D3 PO) Take 1 tablet by mouth every evening.     Multiple Vitamin (MULTIVITAMIN WITH MINERALS) TABS tablet Take 1 tablet by mouth every evening. Centrum Multivitamin     Omega-3 Fatty Acids (FISH OIL PO) Take 1 capsule by mouth daily after supper.     traZODone (DESYREL) 100 MG tablet Take 100 mg by mouth at bedtime.     No current facility-administered medications for this visit.    PHYSICAL EXAMINATION: ECOG PERFORMANCE STATUS: 0 - Asymptomatic  Vitals:   09/12/21 1323  BP: 133/61  Pulse: 68  Resp: 17  Temp: 98 F (36.7 C)  SpO2: 95%  Wt Readings from Last 3 Encounters:  09/12/21 100 lb 3.2 oz (45.5 kg)  08/10/21 99 lb 3.2 oz (45 kg)  06/27/21 108 lb 8 oz (49.2 kg)     GENERAL:alert, no distress and comfortable SKIN: skin color, texture, turgor are normal, no rashes or significant lesions EYES: normal, Conjunctiva are pink and non-injected, sclera clear  Musculoskeletal:no cyanosis of digits and no clubbing  NEURO: alert & oriented x 3 with fluent speech, no focal motor/sensory deficits BREAST: erythema from radiation.  LABORATORY DATA:  I have reviewed the data as  listed CBC Latest Ref Rng & Units 09/12/2021 06/21/2021  WBC 4.0 - 10.5 K/uL 8.3 8.1  Hemoglobin 12.0 - 15.0 g/dL 12.7 12.9  Hematocrit 36.0 - 46.0 % 37.2 38.8  Platelets 150 - 400 K/uL 260 278     CMP Latest Ref Rng & Units 09/12/2021  Glucose 70 - 99 mg/dL 101(H)  BUN 8 - 23 mg/dL 13  Creatinine 0.44 - 1.00 mg/dL 0.74  Sodium 135 - 145 mmol/L 139  Potassium 3.5 - 5.1 mmol/L 3.9  Chloride 98 - 111 mmol/L 102  CO2 22 - 32 mmol/L 32  Calcium 8.9 - 10.3 mg/dL 9.8  Total Protein 6.5 - 8.1 g/dL 7.6  Total Bilirubin 0.3 - 1.2 mg/dL 0.6  Alkaline Phos 38 - 126 U/L 34(L)  AST 15 - 41 U/L 18  ALT 0 - 44 U/L 15      RADIOGRAPHIC STUDIES: I have personally reviewed the radiological images as listed and agreed with the findings in the report. No results found.    Orders Placed This Encounter  Procedures   CBC with Differential/Platelet    Standing Status:   Standing    Number of Occurrences:   50    Standing Expiration Date:   09/12/2022   Comprehensive metabolic panel    Standing Status:   Standing    Number of Occurrences:   50    Standing Expiration Date:   09/12/2022   All questions were answered. The patient knows to call the clinic with any problems, questions or concerns. No barriers to learning was detected. The total time spent in the appointment was 30 minutes.     Truitt Merle, MD 09/12/2021   I, Wilburn Mylar, am acting as scribe for Truitt Merle, MD.   I have reviewed the above documentation for accuracy and completeness, and I agree with the above.

## 2021-09-13 ENCOUNTER — Ambulatory Visit
Admission: RE | Admit: 2021-09-13 | Discharge: 2021-09-13 | Disposition: A | Discharge status: Home / Self Care | Payer: PPO | Source: Ambulatory Visit | Attending: Radiation Oncology | Admitting: Radiation Oncology

## 2021-09-13 ENCOUNTER — Ambulatory Visit: Payer: PPO

## 2021-09-13 DIAGNOSIS — C50212 Malignant neoplasm of upper-inner quadrant of left female breast: Secondary | ICD-10-CM | POA: Diagnosis not present

## 2021-09-14 ENCOUNTER — Ambulatory Visit
Admission: RE | Admit: 2021-09-14 | Discharge: 2021-09-14 | Disposition: A | Discharge status: Home / Self Care | Payer: PPO | Source: Ambulatory Visit | Attending: Radiation Oncology | Admitting: Radiation Oncology

## 2021-09-14 ENCOUNTER — Encounter: Payer: Self-pay | Admitting: Radiation Oncology

## 2021-09-14 ENCOUNTER — Other Ambulatory Visit: Payer: Self-pay

## 2021-09-14 DIAGNOSIS — Z170 Estrogen receptor positive status [ER+]: Secondary | ICD-10-CM | POA: Diagnosis not present

## 2021-09-14 DIAGNOSIS — C50212 Malignant neoplasm of upper-inner quadrant of left female breast: Secondary | ICD-10-CM

## 2021-09-14 MED ORDER — SONAFINE EX EMUL
1.0000 "application " | Freq: Once | CUTANEOUS | Status: AC
  Administered 2021-09-14: 1 via TOPICAL

## 2021-09-20 NOTE — Progress Notes (Signed)
° °                                                                                                                                                          °  Patient Name: Jillian Tyler MRN: [id number] DOB: [birth year] Referring Physician: Moreen Fowler DAVID (Profile Not Attached) Date of Service: 09/14/2021 [facility], [place]                                                        End Of Treatment Note  Diagnoses: C50.212-Malignant neoplasm of upper-inner quadrant of left female breast  Cancer Staging: Stage IA, pT1b, cN0M0, grade 1 invasive lobular carcinoma of the left breast.  Intent: Curative  Radiation Treatment Dates: 08/17/2021 through 09/14/2021 Site Technique Total Dose (Gy) Dose per Fx (Gy) Completed Fx Beam Energies  Breast, Left: Breast_L 3D 42.56/42.56 2.66 16/16 6XFFF  Breast, Left: Breast_L_Bst specialPort 8/8 2 4/4 6E   Narrative: The patient tolerated radiation therapy relatively well. She developed fatigue and anticipated skin changes in the treatment field. She did develop a bit more reaction along the lateral aspect of her incision site that at the conclusion of treatment was consistent with dry desquamation.  Plan: The patient will receive a call in about one month from the radiation oncology department. She will continue follow up with Dr. Burr Medico as well.   ________________________________________________    Carola Rhine, [facility]

## 2021-09-27 ENCOUNTER — Encounter (HOSPITAL_COMMUNITY): Payer: Self-pay

## 2021-10-03 ENCOUNTER — Other Ambulatory Visit: Payer: Self-pay

## 2021-10-03 ENCOUNTER — Ambulatory Visit
Admission: RE | Admit: 2021-10-03 | Discharge: 2021-10-03 | Disposition: A | Discharge status: Home / Self Care | Payer: PPO | Source: Ambulatory Visit | Attending: Radiation Oncology | Admitting: Radiation Oncology

## 2021-10-03 DIAGNOSIS — Z170 Estrogen receptor positive status [ER+]: Secondary | ICD-10-CM

## 2021-10-03 DIAGNOSIS — C50212 Malignant neoplasm of upper-inner quadrant of left female breast: Secondary | ICD-10-CM | POA: Insufficient documentation

## 2021-10-03 NOTE — Progress Notes (Signed)
?  Radiation Oncology         [phone] ?________________________________ ? ?Name: Jillian Tyler MRN: [id number]  ?Date of Service: 10/03/2021  DOB: 1946/06/15 ? ?Post Treatment Telephone Note ? ?Diagnosis:    Stage IA, pT1b, cN0M0, grade 1, ER/PR positive invasive lobular carcinoma of the left breast. ? ?Intent: Curative ? ?Radiation Treatment Dates: 08/17/2021 through 09/14/2021 ?Site Technique Total Dose (Gy) Dose per Fx (Gy) Completed Fx Beam Energies  ?Breast, Left: Breast_L 3D 42.56/42.56 2.66 16/16 6XFFF  ?Breast, Left: Breast_L_Bst specialPort 8/8 2 4/4 6E  ? ?Narrative: The patient tolerated radiation therapy relatively well. She developed fatigue and anticipated skin changes in the treatment field. She did develop a bit more reaction along the lateral aspect of her incision site that at the conclusion of treatment was consistent with dry desquamation. ? ? ?Impression/Plan: ?1. Stage IA, pT1b, cN0M0, grade 1, ER/PR positive invasive lobular carcinoma of the left breast. The patient has been doing well since completion of radiotherapy. We discussed that we would be happy to continue to follow her as needed, but she will also continue to follow up with Dr. Burr Medico in medical oncology. She was counseled on skin care as well as measures to avoid sun exposure to this area.  ?2. Survivorship. We discussed the importance of survivorship evaluation and encouraged her to attend her upcoming visit with that clinic. ? ? ? ? ? ?Carola Rhine, PAC  ? ? ? ? ?

## 2021-10-17 ENCOUNTER — Ambulatory Visit: Payer: PPO

## 2021-12-14 ENCOUNTER — Inpatient Hospital Stay: Payer: PPO | Attending: Nurse Practitioner | Admitting: Nurse Practitioner

## 2021-12-14 ENCOUNTER — Other Ambulatory Visit: Payer: Self-pay

## 2021-12-14 ENCOUNTER — Encounter: Payer: Self-pay | Admitting: Nurse Practitioner

## 2021-12-14 VITALS — BP 130/63 | HR 62 | Temp 97.3°F | Resp 16 | Wt 102.7 lb

## 2021-12-14 DIAGNOSIS — Z87891 Personal history of nicotine dependence: Secondary | ICD-10-CM | POA: Insufficient documentation

## 2021-12-14 DIAGNOSIS — N951 Menopausal and female climacteric states: Secondary | ICD-10-CM | POA: Insufficient documentation

## 2021-12-14 DIAGNOSIS — Z170 Estrogen receptor positive status [ER+]: Secondary | ICD-10-CM | POA: Insufficient documentation

## 2021-12-14 DIAGNOSIS — Z79811 Long term (current) use of aromatase inhibitors: Secondary | ICD-10-CM | POA: Insufficient documentation

## 2021-12-14 DIAGNOSIS — Z923 Personal history of irradiation: Secondary | ICD-10-CM | POA: Insufficient documentation

## 2021-12-14 DIAGNOSIS — C50212 Malignant neoplasm of upper-inner quadrant of left female breast: Secondary | ICD-10-CM | POA: Diagnosis not present

## 2021-12-14 NOTE — Progress Notes (Signed)
? ?CLINIC:  ?Survivorship  ? ?Patient Care Team: ?Antony Contras, MD as PCP - General (Family Medicine) ?Rolm Bookbinder, MD as Consulting Physician (General Surgery) ?Kyung Rudd, MD as Consulting Physician (Radiation Oncology) ?Truitt Merle, MD as Consulting Physician (Hematology) ?Rockwell Germany, RN as Oncology Nurse Navigator ?Mauro Kaufmann, RN as Oncology Nurse Navigator ?Alla Feeling, NP as Nurse Practitioner (Nurse Practitioner)  ? ?REASON FOR VISIT:  ?Routine follow-up post-treatment for a recent history of breast cancer. ? ?BRIEF ONCOLOGIC HISTORY:  ?Oncology History Overview Note  ? Cancer Staging  ?Malignant neoplasm of upper-inner quadrant of left breast in female, estrogen receptor positive ([place]) ?Staging form: Breast, AJCC 8th Edition ?- Clinical stage from 05/23/2021: Stage IA (cT1c, cN0, cM0, G1, ER+, PR+, HER2-) - Signed by Truitt Merle, MD on 06/07/2021 ?- Pathologic stage from 06/27/2021: Stage Unknown (pT1b, pNX, cM0, G1, ER+, PR+, HER2-) - Signed by Truitt Merle, MD on 09/10/2021 ? ? ?  ?Malignant neoplasm of upper-inner quadrant of left breast in female, estrogen receptor positive ([place])  ?05/13/2021 Mammogram  ? EXAM: ?DIGITAL DIAGNOSTIC UNILATERAL LEFT MAMMOGRAM WITH TOMOSYNTHESIS AND CAD;  ?ULTRASOUND LEFT BREAST LIMITED ? ?IMPRESSION: ?1. There is a highly suspicious 1.1 cm mass in the left breast at 10 ?o'clock. ?  ?2.  No evidence of left axillary lymphadenopathy. ?  ?05/23/2021 Cancer Staging  ? Staging form: Breast, AJCC 8th Edition ?- Clinical stage from 05/23/2021: Stage IA (cT1c, cN0, cM0, G1, ER+, PR+, HER2-) - Signed by Truitt Merle, MD on 06/07/2021 ?Stage prefix: Initial diagnosis ?Histologic grading system: 3 grade system ? ?  ?05/23/2021 Pathology Results  ? Diagnosis ?Breast, left, needle core biopsy, 10 o'clock 5 cmfn ?- INVASIVE MAMMARY CARCINOMA ?- MAMMARY CARCINOMA IN-SITU ?- SEE COMMENT ?Microscopic Comment ?The biopsy material shows an infiltrative proliferation of cells with  arranged linearly and in small clusters. Based on the biopsy, the carcinoma appears Nottingham grade 1-2 of 3 and measures 0.5 cm in greatest linear extent. ? ?E-cadherin is negative supporting lobular origin. ? ?PROGNOSTIC INDICATORS ?Results: ?The tumor cells are EQUIVOCAL for Her2 (2+). Her2 by FISH will be performed and results reported separately. ?Estrogen Receptor: 100%, POSITIVE, STRONG STAINING INTENSITY ?Progesterone Receptor: 100%, POSITIVE, STRONG STAINING INTENSITY ?Proliferation Marker Ki67: 5% ? ?FLUORESCENCE IN-SITU HYBRIDIZATION ?Results: ?GROUP 5: HER2 **NEGATIVE** ?  ?06/07/2021 Initial Diagnosis  ? Malignant neoplasm of upper-inner quadrant of left breast in female, estrogen receptor positive ([place]) ? ?  ?06/27/2021 Cancer Staging  ? Staging form: Breast, AJCC 8th Edition ?- Pathologic stage from 06/27/2021: Stage Unknown (pT1b, pNX, cM0, G1, ER+, PR+, HER2-) - Signed by Truitt Merle, MD on 09/10/2021 ?Stage prefix: Initial diagnosis ?Multigene prognostic tests performed: None ?Histologic grading system: 3 grade system ? ?  ?06/27/2021 Definitive Surgery  ? FINAL MICROSCOPIC DIAGNOSIS:  ? ?A. BREAST, LEFT, LUMPECTOMY:  ?-  Invasive lobular carcinoma, Nottingham grade 1 of 3, 0.9 cm  ?-  Margins uninvolved by carcinoma (<0.1 cm; posterior margin)  ?-  Previous biopsy site changes present  ?-  See oncology table and comment below  ?Comment(s): Cytokeratin AE1/3 highlights the presence of invasive carcinoma in proximity to the inked posterior margin.  ?  ?12/14/2021 Survivorship  ? SCP delivered by Cira Rue ?  ? ? ?INTERVAL HISTORY:  ?Jillian Tyler presents to the Survivorship Clinic today for our initial meeting to review her survivorship care plan detailing her treatment course for breast cancer, as well as monitoring long-term side effects of that treatment, education regarding health maintenance,  screening, and overall wellness and health promotion.    ? ?She is doing okay in general.  She has focal  sensitivity at the left upper breast/anterior chest wall at the scar.  Denies new lump/mass, nipple discharge or inversion, or skin change.  She had mild vaginal spotting when she began exemestane which has resolved.  She has moderate to severe drenching hot flashes up to 3/day, sometimes occur at night but are inconsistent.  Denies bone or joint pain.  Energy and appetite are normal, she remains very active as her grandchildren and great-grandchildren live with her.  She goes up and down stairs and does housework.   ? ? ?ONCOLOGY TREATMENT TEAM:  ?1. Surgeon:  Dr. Donne Hazel at [facility] ?2. Medical Oncologist: Dr. Burr Medico ?3. Radiation Oncologist: Dr. Lisbeth Renshaw ?  ? ?PAST MEDICAL/SURGICAL HISTORY:  ?Past Medical History:  ?Diagnosis Date  ? Allergy   ? Breast cancer ([place])   ? ?Past Surgical History:  ?Procedure Laterality Date  ? BREAST CYST ASPIRATION Right   ? 20 + yrs ago  ? BREAST LUMPECTOMY WITH RADIOACTIVE SEED LOCALIZATION Left 06/27/2021  ? Procedure: LEFT BREAST LUMPECTOMY WITH RADIOACTIVE SEED LOCALIZATION;  Surgeon: Rolm Bookbinder, MD;  Location: [place];  Service: General;  Laterality: Left;  ? ? ? ?ALLERGIES:  ?Allergies  ?Allergen Reactions  ? Hydrocod Poli-Chlorphe Poli Er Other (See Comments)  ?  Unknown reaction  ? Influenza Vaccines Diarrhea and Nausea And Vomiting  ? Latex Hives  ? Augmentin [Amoxicillin-Pot Clavulanate] Diarrhea and Nausea And Vomiting  ? ? ? ?CURRENT MEDICATIONS:  ?Outpatient Encounter Medications as of 12/14/2021  ?Medication Sig Note  ? acetaminophen (TYLENOL) 500 MG tablet Take 500 mg by mouth every 6 (six) hours as needed (headache). 06/17/2021: seldom  ? Cholecalciferol (VITAMIN D3 PO) Take 1 tablet by mouth every evening.   ? exemestane (AROMASIN) 25 MG tablet Take 1 tablet (25 mg total) by mouth daily after breakfast.   ? Multiple Vitamin (MULTIVITAMIN WITH MINERALS) TABS tablet Take 1 tablet by mouth every evening. Centrum Multivitamin   ? Omega-3 Fatty Acids  (FISH OIL PO) Take 1 capsule by mouth daily after supper.   ? traZODone (DESYREL) 100 MG tablet Take 100 mg by mouth at bedtime.   ? ?No facility-administered encounter medications on file as of 12/14/2021.  ? ? ? ?ONCOLOGIC FAMILY HISTORY:  ?Family History  ?Problem Relation Age of Onset  ? Stroke Mother   ? Prostate cancer Father   ? Kidney cancer Brother   ? Lung disease Neg Hx   ? ? ? ?GENETIC COUNSELING/TESTING: ?No ? ?SOCIAL HISTORY:  ?Courteny L Druckenmiller is married and lives with her family.  She denies any current or history of alcohol, or illicit drug use.  She quit smoking 1 year ago. ? ? ?PHYSICAL EXAMINATION:  ?Vital Signs: ?  ?Vitals:  ? 12/14/21 1230  ?BP: 130/63  ?Pulse: 62  ?Resp: 16  ?Temp: (!) 97.3 ?F (36.3 ?C)  ?SpO2: 98%  ? ?Filed Weights  ? 12/14/21 1230  ?Weight: 102 lb 11.2 oz (46.6 kg)  ? ?General: Well-nourished, well-appearing female in no acute distress.   ?HEENT: Sclerae anicteric.  ?Lymph: No cervical, supraclavicular, or infraclavicular lymphadenopathy noted on palpation.  ?Respiratory:  breathing non-labored.  ?GI: Abdomen soft and round; non-tender, non-distended. Bowel sounds normoactive.  ?Neuro: No focal deficits. Steady gait.  ?Psych: Mood and affect normal and appropriate for situation.  ?Extremities: No edema. ?MSK: No focal spinal tenderness to palpation.  Full  range of motion in bilateral upper extremities ?Skin: Warm and dry. ?Breast exam: Breasts are symmetrical without nipple discharge or inversion.  S/p left lumpectomy, incision completely healed with tenderness at the site.  No palpable mass or nodularity in either breast or axilla that I could appreciate. ? ?LABORATORY DATA:  ?None for this visit. ? ?DIAGNOSTIC IMAGING:  ?None for this visit.  ? ?  ? ?ASSESSMENT AND PLAN:  ?JillianLeonard Hendler is a pleasant 76 y.o. female with Stage 1 left breast invasive lobular carcinoma, ER+/PR+/HER2-, diagnosed in 04/2021, treated with lumpectomy, adjuvant radiation therapy, and anti-estrogen  therapy with exemestane beginning in 08/2021.  She presents to the Survivorship Clinic for our initial meeting and routine follow-up post-completion of treatment for breast cancer.  ? ? ?1. Stage 1 left breast

## 2021-12-24 ENCOUNTER — Other Ambulatory Visit: Payer: Self-pay | Admitting: Hematology

## 2022-02-08 IMAGING — MG DIGITAL SCREENING BILAT W/ TOMO W/ CAD
8 series · 9 of 24 positions shown · non-contrast
Comparison: Previous exam(s).

CLINICAL DATA: Screening.

EXAM:
DIGITAL SCREENING BILATERAL MAMMOGRAM WITH TOMO AND CAD

[L CC synth-2D]
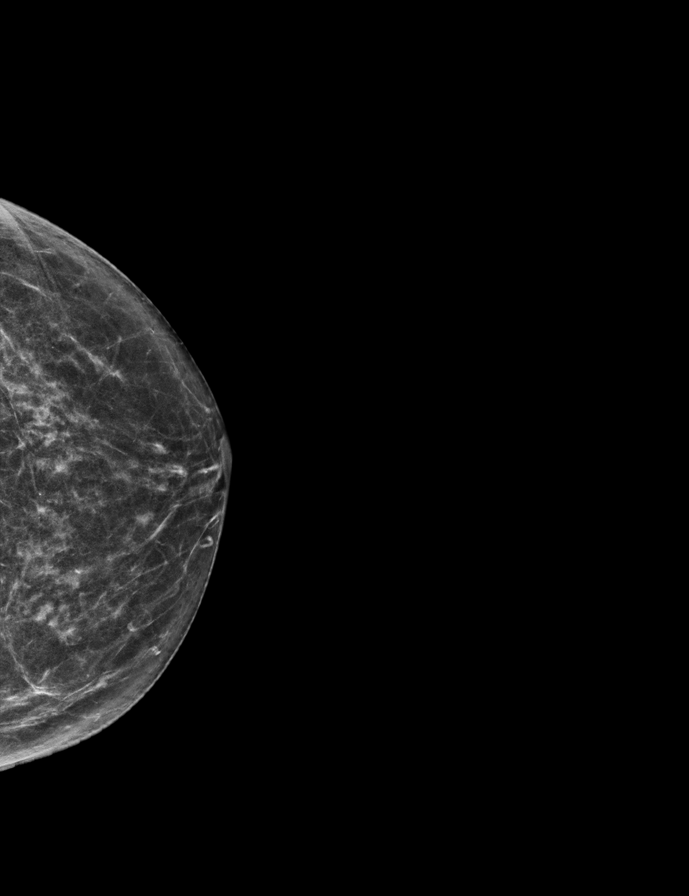

[L MLO synth-2D]
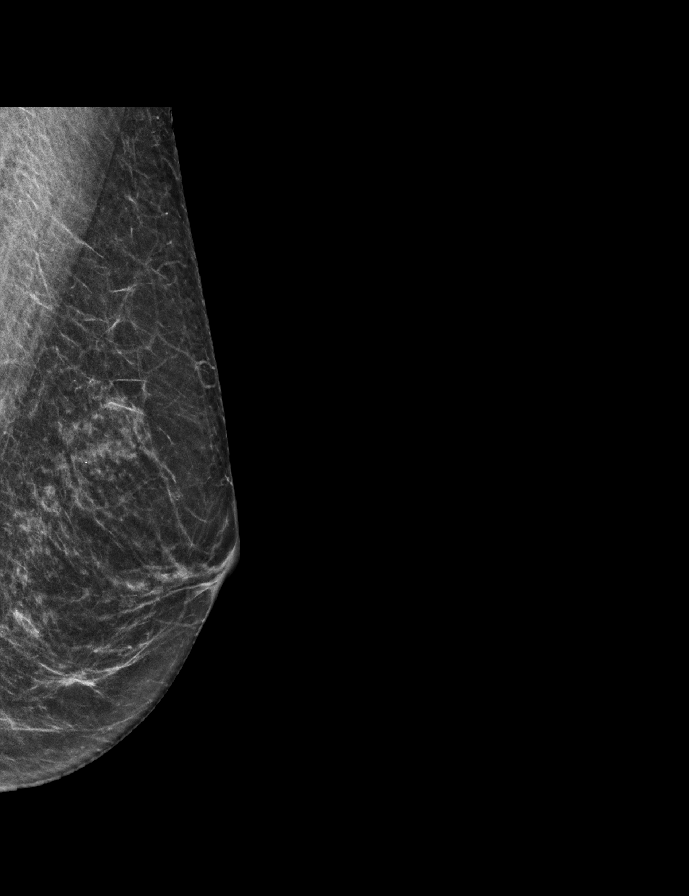

[R MLO synth-2D]
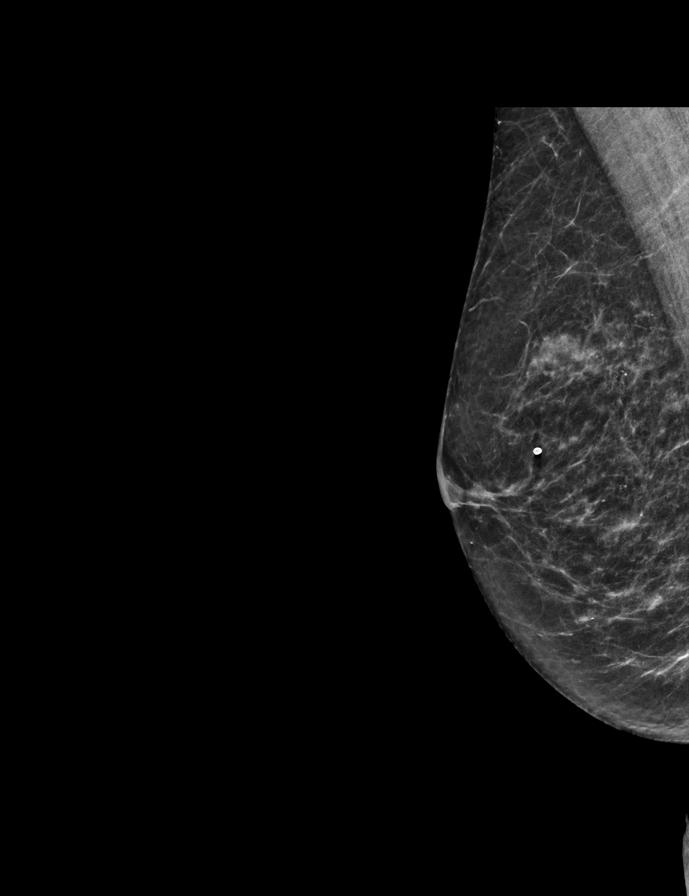

[R CC synth-2D]
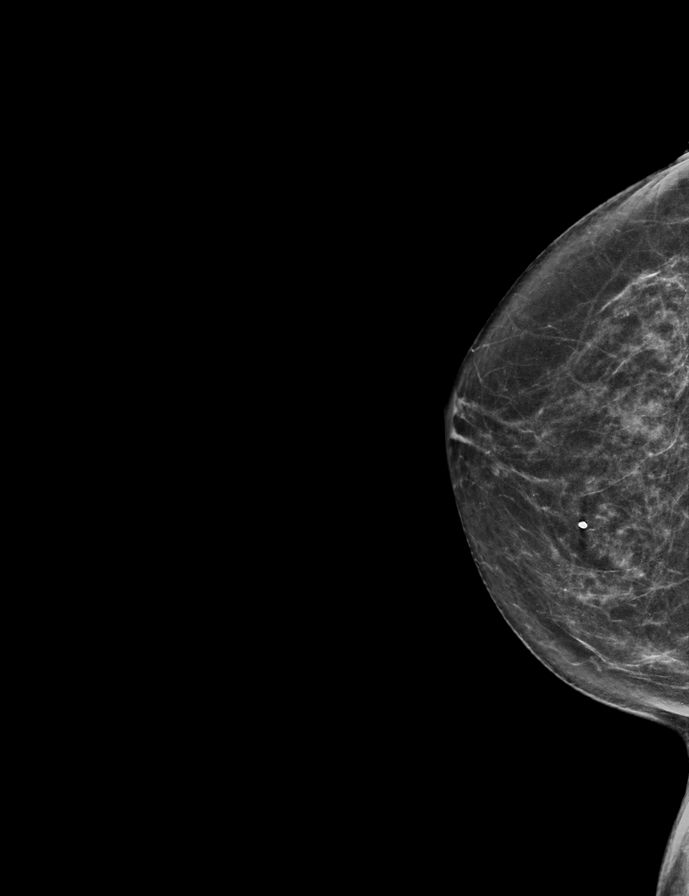

[R CC tomo · 2 of 59 frames shown]
[frame 20/59]
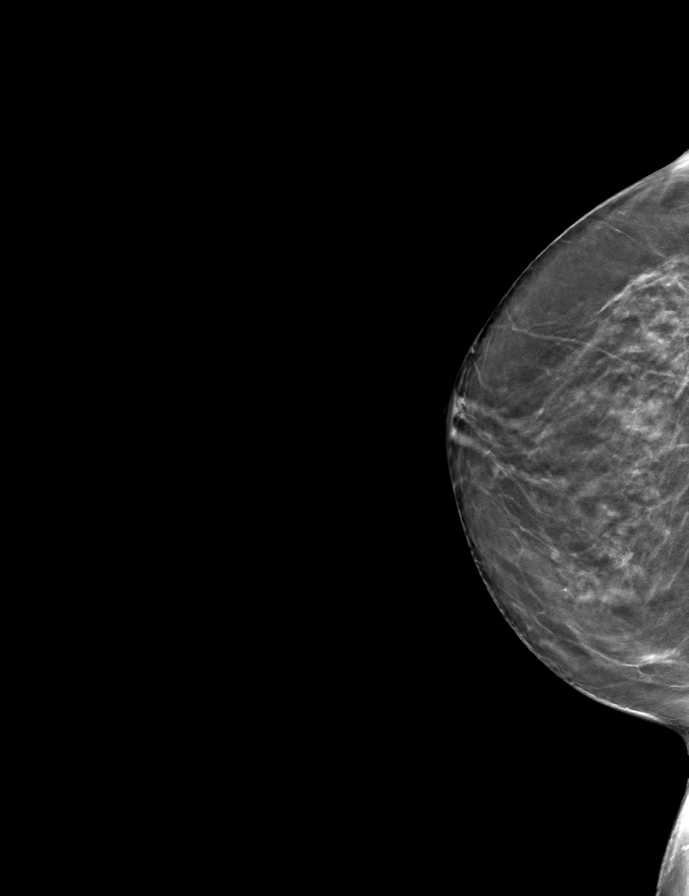
[frame 30/59]
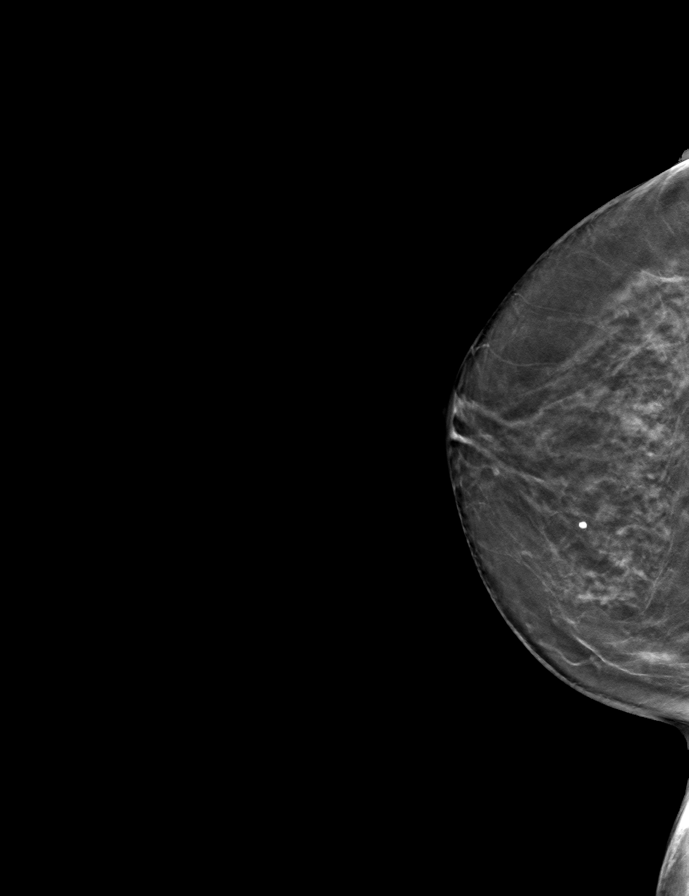

[L MLO tomo · tomo slice 27/53.0]
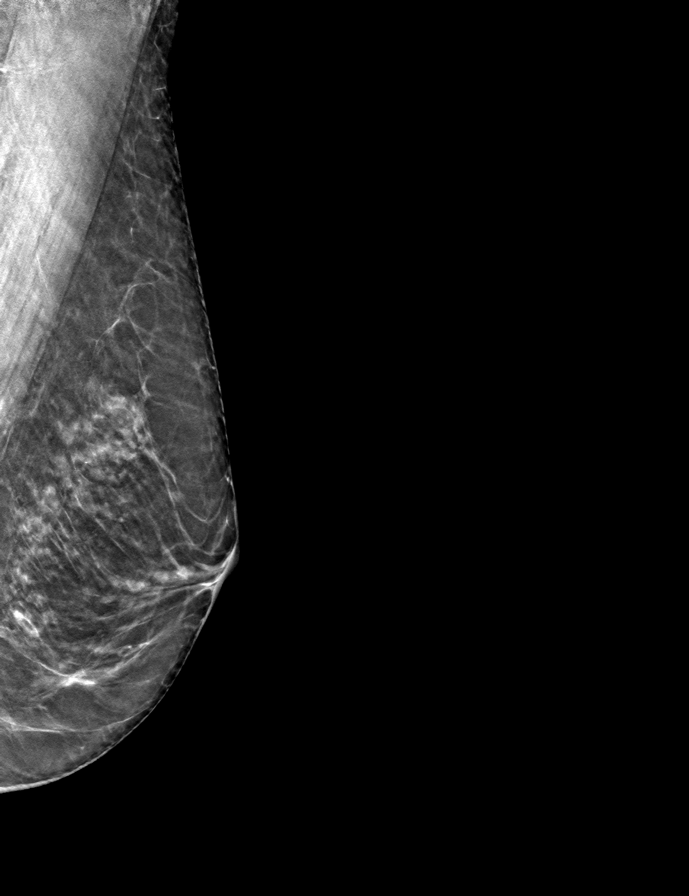

[R MLO tomo · tomo slice 25/50.0]
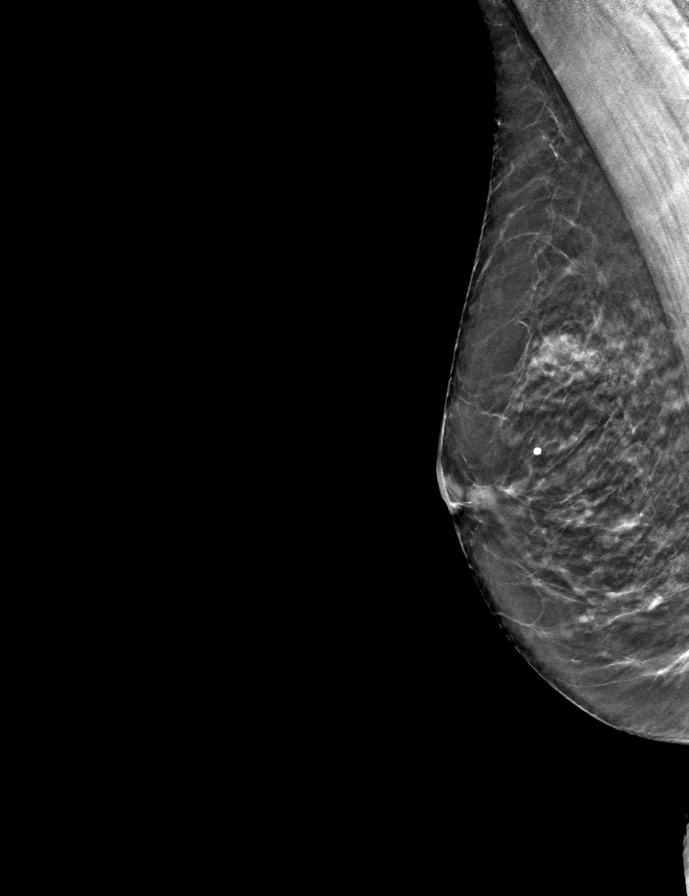

[L CC tomo · tomo slice 29/58.0]
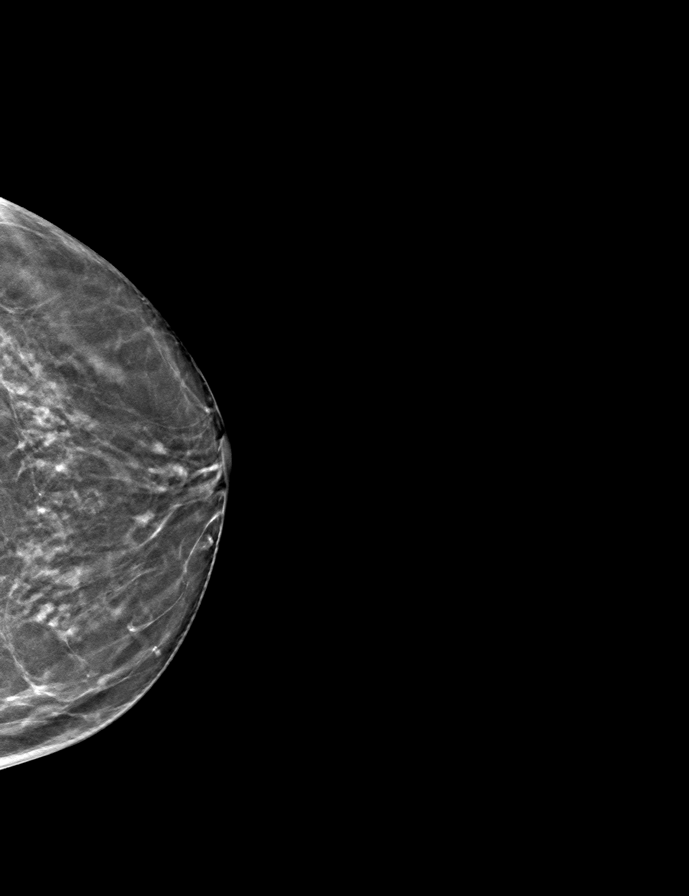

[9 of 24 positions shown; findings below may reference images not displayed]

ACR Breast Density Category b: There are scattered areas of
fibroglandular density.
FINDINGS: There are no findings suspicious for malignancy. Images were
processed with CAD.
IMPRESSION: No mammographic evidence of malignancy. A result letter of this
screening mammogram will be mailed directly to the patient.

RECOMMENDATION:
Screening mammogram in one year. (Code:CN-[id number])

BI-RADS CATEGORY  1: Negative.

## 2022-03-15 ENCOUNTER — Inpatient Hospital Stay: Payer: PPO | Admitting: Hematology

## 2022-03-15 ENCOUNTER — Encounter: Payer: Self-pay | Admitting: Hematology

## 2022-03-15 ENCOUNTER — Other Ambulatory Visit: Payer: Self-pay

## 2022-03-15 ENCOUNTER — Inpatient Hospital Stay: Payer: PPO | Attending: Nurse Practitioner

## 2022-03-15 VITALS — BP 152/61 | HR 63 | Temp 98.1°F | Wt 105.4 lb

## 2022-03-15 DIAGNOSIS — R519 Headache, unspecified: Secondary | ICD-10-CM | POA: Diagnosis not present

## 2022-03-15 DIAGNOSIS — Z923 Personal history of irradiation: Secondary | ICD-10-CM | POA: Insufficient documentation

## 2022-03-15 DIAGNOSIS — Z79811 Long term (current) use of aromatase inhibitors: Secondary | ICD-10-CM | POA: Diagnosis not present

## 2022-03-15 DIAGNOSIS — N951 Menopausal and female climacteric states: Secondary | ICD-10-CM | POA: Diagnosis not present

## 2022-03-15 DIAGNOSIS — C50212 Malignant neoplasm of upper-inner quadrant of left female breast: Secondary | ICD-10-CM | POA: Insufficient documentation

## 2022-03-15 DIAGNOSIS — Z170 Estrogen receptor positive status [ER+]: Secondary | ICD-10-CM

## 2022-03-15 LAB — CBC WITH DIFFERENTIAL/PLATELET
Abs Immature Granulocytes: 0.02 10*3/uL (ref 0.00–0.07)
Basophils Absolute: 0 10*3/uL (ref 0.0–0.1)
Basophils Relative: 1 %
Eosinophils Absolute: 0.1 10*3/uL (ref 0.0–0.5)
Eosinophils Relative: 2 %
HCT: 38.2 % (ref 36.0–46.0)
Hemoglobin: 12.9 g/dL (ref 12.0–15.0)
Immature Granulocytes: 0 %
Lymphocytes Relative: 21 %
Lymphs Abs: 1.5 10*3/uL (ref 0.7–4.0)
MCH: 30.8 pg (ref 26.0–34.0)
MCHC: 33.8 g/dL (ref 30.0–36.0)
MCV: 91.2 fL (ref 80.0–100.0)
Monocytes Absolute: 0.6 10*3/uL (ref 0.1–1.0)
Monocytes Relative: 8 %
Neutro Abs: 5 10*3/uL (ref 1.7–7.7)
Neutrophils Relative %: 68 %
Platelets: 238 10*3/uL (ref 150–400)
RBC: 4.19 MIL/uL (ref 3.87–5.11)
RDW: 12.7 % (ref 11.5–15.5)
WBC: 7.2 10*3/uL (ref 4.0–10.5)
nRBC: 0 % (ref 0.0–0.2)

## 2022-03-15 LAB — CMP (CANCER CENTER ONLY)
ALT: 22 U/L (ref 0–44)
AST: 22 U/L (ref 15–41)
Albumin: 4.3 g/dL (ref 3.5–5.0)
Alkaline Phosphatase: 31 U/L — ABNORMAL LOW (ref 38–126)
Anion gap: 9 (ref 5–15)
BUN: 19 mg/dL (ref 8–23)
CO2: 27 mmol/L (ref 22–32)
Calcium: 9.8 mg/dL (ref 8.9–10.3)
Chloride: 104 mmol/L (ref 98–111)
Creatinine: 0.82 mg/dL (ref 0.44–1.00)
GFR, Estimated: >60 mL/min (ref >60)
Glucose, Bld: 106 mg/dL — ABNORMAL HIGH (ref 70–99)
Potassium: 4.3 mmol/L (ref 3.5–5.1)
Sodium: 140 mmol/L (ref 135–145)
Total Bilirubin: 0.7 mg/dL (ref 0.3–1.2)
Total Protein: 8 g/dL (ref 6.5–8.1)

## 2022-03-15 NOTE — Progress Notes (Signed)
[place]   [phone] Fax:([phone]) [phone]   Clinic Follow up Note   Patient Care Team: Antony Contras, MD as PCP - General (Family Medicine) Rolm Bookbinder, MD as Consulting Physician (General Surgery) Kyung Rudd, MD as Consulting Physician (Radiation Oncology) Truitt Merle, MD as Consulting Physician (Hematology) Rockwell Germany, RN as Oncology Nurse Navigator Mauro Kaufmann, RN as Oncology Nurse Navigator Alla Feeling, NP as Nurse Practitioner (Nurse Practitioner)  Date of Service:  03/15/2022  CHIEF COMPLAINT: f/u of left breast cancer  CURRENT THERAPY:  Exemestane, starting 09/2021  ASSESSMENT & PLAN:  Jillian Tyler is a 76 y.o. post-menopausal female with   1. Malignant neoplasm of upper-inner quadrant of left breast, invasive lobular carcinoma, stage IA, p(T1b, N0), ER+/PR+/HER2-, Grade 1-2 -found on screening mammogram. S/p left lumpectomy on 06/27/21 with Dr. Donne Hazel showed 0.9 cm ILC, grade 1. Margins close but uninvolved. -s/p radiation under Dr. Lisbeth Renshaw 08/17/21 - 09/14/21. -she started exemestane in 09/2021, plan for 7 years due to lobular cancer. She is tolerating moderately well with intense hot flashes, and more frequent headaches. She expressed concern about the cost of exemestane. I advised her to look around, including with mail pharmacy, for a cheaper price. If she can't find anything cheaper, we can switch her medicine, but I discussed anastrozole and letrozole can cause more joint pain.  -she continues to have some soreness to her left breast, which I reassured her is very common after surgery and RT.  -continue exemestane and surveillance; she is scheduled for mammogram 06/14/22.  2. Hot flashes, Headaches -secondary to exemestane -she is currently managing at home.   3. Bone Health  -Her most recent DEXA was normal, per pt. -repeat scheduled for 06/14/22     PLAN:  -continue exemestane, she will check mail pharmacy to  see if she has less copay  -lab and f/u in 6 months   No problem-specific Assessment & Plan notes found for this encounter.   SUMMARY OF ONCOLOGIC HISTORY: Oncology History Overview Note   Cancer Staging  Malignant neoplasm of upper-inner quadrant of left breast in female, estrogen receptor positive ([place]) Staging form: Breast, AJCC 8th Edition - Clinical stage from 05/23/2021: Stage IA (cT1c, cN0, cM0, G1, ER+, PR+, HER2-) - Signed by Truitt Merle, MD on 06/07/2021 - Pathologic stage from 06/27/2021: Stage Unknown (pT1b, pNX, cM0, G1, ER+, PR+, HER2-) - Signed by Truitt Merle, MD on 09/10/2021     Malignant neoplasm of upper-inner quadrant of left breast in female, estrogen receptor positive ([place])  05/13/2021 Mammogram   EXAM: DIGITAL DIAGNOSTIC UNILATERAL LEFT MAMMOGRAM WITH TOMOSYNTHESIS AND CAD;  ULTRASOUND LEFT BREAST LIMITED  IMPRESSION: 1. There is a highly suspicious 1.1 cm mass in the left breast at 10 o'clock.   2.  No evidence of left axillary lymphadenopathy.   05/23/2021 Cancer Staging   Staging form: Breast, AJCC 8th Edition - Clinical stage from 05/23/2021: Stage IA (cT1c, cN0, cM0, G1, ER+, PR+, HER2-) - Signed by Truitt Merle, MD on 06/07/2021 Stage prefix: Initial diagnosis Histologic grading system: 3 grade system   05/23/2021 Pathology Results   Diagnosis Breast, left, needle core biopsy, 10 o'clock 5 cmfn - INVASIVE MAMMARY CARCINOMA - MAMMARY CARCINOMA IN-SITU - SEE COMMENT Microscopic Comment The biopsy material shows an infiltrative proliferation of cells with arranged linearly and in small clusters. Based on the biopsy, the carcinoma appears Nottingham grade 1-2 of 3 and measures 0.5 cm in greatest linear extent.  E-cadherin is negative  supporting lobular origin.  PROGNOSTIC INDICATORS Results: The tumor cells are EQUIVOCAL for Her2 (2+). Her2 by FISH will be performed and results reported separately. Estrogen Receptor: 100%, POSITIVE, STRONG STAINING  INTENSITY Progesterone Receptor: 100%, POSITIVE, STRONG STAINING INTENSITY Proliferation Marker Ki67: 5%  FLUORESCENCE IN-SITU HYBRIDIZATION Results: GROUP 5: HER2 **NEGATIVE**   06/07/2021 Initial Diagnosis   Malignant neoplasm of upper-inner quadrant of left breast in female, estrogen receptor positive (HCC)   06/27/2021 Cancer Staging   Staging form: Breast, AJCC 8th Edition - Pathologic stage from 06/27/2021: Stage Unknown (pT1b, pNX, cM0, G1, ER+, PR+, HER2-) - Signed by Malachy Mood, MD on 09/10/2021 Stage prefix: Initial diagnosis Multigene prognostic tests performed: None Histologic grading system: 3 grade system   06/27/2021 Definitive Surgery   FINAL MICROSCOPIC DIAGNOSIS:   A. BREAST, LEFT, LUMPECTOMY:  -  Invasive lobular carcinoma, Nottingham grade 1 of 3, 0.9 cm  -  Margins uninvolved by carcinoma (<0.1 cm; posterior margin)  -  Previous biopsy site changes present  -  See oncology table and comment below  Comment(s): Cytokeratin AE1/3 highlights the presence of invasive carcinoma in proximity to the inked posterior margin.    12/14/2021 Survivorship   SCP delivered by Santiago Glad      INTERVAL HISTORY:  Jillian Tyler is here for a follow up of breast cancer. She was last seen by me on 09/12/21, with survivorship in the interim. She presents to the clinic alone. She reports she is doing well overall. She notes she is having more frequent headaches, unsure of the cause. She also reports "terrible" hot flashes, both during the day and at night. She notes they will wake her up at night. She notes continued soreness to her breast, adding that caring for her granddaughter doesn't help. She also reports some new discoloration around her nipple.   All other systems were reviewed with the patient and are negative.  MEDICAL HISTORY:  Past Medical History:  Diagnosis Date   Allergy    Breast cancer (HCC)     SURGICAL HISTORY: Past Surgical History:  Procedure  Laterality Date   BREAST CYST ASPIRATION Right    20 + yrs ago   BREAST LUMPECTOMY WITH RADIOACTIVE SEED LOCALIZATION Left 06/27/2021   Procedure: LEFT BREAST LUMPECTOMY WITH RADIOACTIVE SEED LOCALIZATION;  Surgeon: Emelia Loron, MD;  Location: [facility] OR;  Service: General;  Laterality: Left;    I have reviewed the social history and family history with the patient and they are unchanged from previous note.  ALLERGIES:  is allergic to hydrocod poli-chlorphe poli er, influenza vaccines, latex, and augmentin [amoxicillin-pot clavulanate].  MEDICATIONS:  Current Outpatient Medications  Medication Sig Dispense Refill   acetaminophen (TYLENOL) 500 MG tablet Take 500 mg by mouth every 6 (six) hours as needed (headache).     Cholecalciferol (VITAMIN D3 PO) Take 1 tablet by mouth every evening.     exemestane (AROMASIN) 25 MG tablet TAKE 1 TABLET BY MOUTH DAILY AFTER BREAKFAST. 90 tablet 1   Multiple Vitamin (MULTIVITAMIN WITH MINERALS) TABS tablet Take 1 tablet by mouth every evening. Centrum Multivitamin     Omega-3 Fatty Acids (FISH OIL PO) Take 1 capsule by mouth daily after supper.     traZODone (DESYREL) 100 MG tablet Take 100 mg by mouth at bedtime.     No current facility-administered medications for this visit.    PHYSICAL EXAMINATION: ECOG PERFORMANCE STATUS: 0 - Asymptomatic  Vitals:   03/15/22 1255  BP: (!) 152/61  Pulse: 63  Temp: 98.1 F (36.7 C)  SpO2: 95%   Wt Readings from Last 3 Encounters:  03/15/22 105 lb 6.4 oz (47.8 kg)  12/14/21 102 lb 11.2 oz (46.6 kg)  09/12/21 100 lb 3.2 oz (45.5 kg)     GENERAL:alert, no distress and comfortable SKIN: skin color, texture, turgor are normal, no rashes or significant lesions EYES: normal, Conjunctiva are pink and non-injected, sclera clear  NECK: supple, thyroid normal size, non-tender, without nodularity LYMPH:  no palpable lymphadenopathy in the cervical, axillary LUNGS: clear to auscultation and percussion with  normal breathing effort HEART: regular rate & rhythm and no murmurs and no lower extremity edema ABDOMEN:abdomen soft, non-tender and normal bowel sounds Musculoskeletal:no cyanosis of digits and no clubbing  NEURO: alert & oriented x 3 with fluent speech, no focal motor/sensory deficits BREAST: No palpable mass, nodules or adenopathy bilaterally. Breast exam benign.   LABORATORY DATA:  I have reviewed the data as listed    Latest Ref Rng & Units 03/15/2022   12:32 PM 09/12/2021    1:58 PM 06/21/2021    2:25 PM  CBC  WBC 4.0 - 10.5 K/uL 7.2  8.3  8.1   Hemoglobin 12.0 - 15.0 g/dL 12.9  12.7  12.9   Hematocrit 36.0 - 46.0 % 38.2  37.2  38.8   Platelets 150 - 400 K/uL 238  260  278         Latest Ref Rng & Units 03/15/2022   12:32 PM 09/12/2021    1:58 PM  CMP  Glucose 70 - 99 mg/dL 106  101   BUN 8 - 23 mg/dL 19  13   Creatinine 0.44 - 1.00 mg/dL 0.82  0.74   Sodium 135 - 145 mmol/L 140  139   Potassium 3.5 - 5.1 mmol/L 4.3  3.9   Chloride 98 - 111 mmol/L 104  102   CO2 22 - 32 mmol/L 27  32   Calcium 8.9 - 10.3 mg/dL 9.8  9.8   Total Protein 6.5 - 8.1 g/dL 8.0  7.6   Total Bilirubin 0.3 - 1.2 mg/dL 0.7  0.6   Alkaline Phos 38 - 126 U/L 31  34   AST 15 - 41 U/L 22  18   ALT 0 - 44 U/L 22  15       RADIOGRAPHIC STUDIES: I have personally reviewed the radiological images as listed and agreed with the findings in the report. No results found.    No orders of the defined types were placed in this encounter.  All questions were answered. The patient knows to call the clinic with any problems, questions or concerns. No barriers to learning was detected. The total time spent in the appointment was 30 minutes.     Truitt Merle, MD 03/15/2022   I, Wilburn Mylar, am acting as scribe for Truitt Merle, MD.   I have reviewed the above documentation for accuracy and completeness, and I agree with the above.

## 2022-03-20 ENCOUNTER — Telehealth: Payer: Self-pay | Admitting: Hematology

## 2022-03-20 NOTE — Telephone Encounter (Signed)
Scheduled follow-up appointment per 8/16 los. Patient is aware.

## 2022-04-27 ENCOUNTER — Other Ambulatory Visit: Payer: Self-pay | Admitting: Nurse Practitioner

## 2022-04-29 DIAGNOSIS — J0100 Acute maxillary sinusitis, unspecified: Secondary | ICD-10-CM | POA: Diagnosis not present

## 2022-06-08 DIAGNOSIS — Z1211 Encounter for screening for malignant neoplasm of colon: Secondary | ICD-10-CM | POA: Diagnosis not present

## 2022-06-08 DIAGNOSIS — J439 Emphysema, unspecified: Secondary | ICD-10-CM | POA: Diagnosis not present

## 2022-06-08 DIAGNOSIS — E7800 Pure hypercholesterolemia, unspecified: Secondary | ICD-10-CM | POA: Diagnosis not present

## 2022-06-08 DIAGNOSIS — C50912 Malignant neoplasm of unspecified site of left female breast: Secondary | ICD-10-CM | POA: Diagnosis not present

## 2022-06-08 DIAGNOSIS — I700 Atherosclerosis of aorta: Secondary | ICD-10-CM | POA: Diagnosis not present

## 2022-06-08 DIAGNOSIS — J302 Other seasonal allergic rhinitis: Secondary | ICD-10-CM | POA: Diagnosis not present

## 2022-06-08 DIAGNOSIS — G4700 Insomnia, unspecified: Secondary | ICD-10-CM | POA: Diagnosis not present

## 2022-06-08 DIAGNOSIS — N951 Menopausal and female climacteric states: Secondary | ICD-10-CM | POA: Diagnosis not present

## 2022-06-08 DIAGNOSIS — Z0000 Encounter for general adult medical examination without abnormal findings: Secondary | ICD-10-CM | POA: Diagnosis not present

## 2022-06-08 DIAGNOSIS — Z23 Encounter for immunization: Secondary | ICD-10-CM | POA: Diagnosis not present

## 2022-06-08 DIAGNOSIS — Z1331 Encounter for screening for depression: Secondary | ICD-10-CM | POA: Diagnosis not present

## 2022-06-10 DIAGNOSIS — I700 Atherosclerosis of aorta: Secondary | ICD-10-CM | POA: Diagnosis not present

## 2022-06-10 DIAGNOSIS — G4700 Insomnia, unspecified: Secondary | ICD-10-CM | POA: Diagnosis not present

## 2022-06-10 DIAGNOSIS — Z87891 Personal history of nicotine dependence: Secondary | ICD-10-CM | POA: Diagnosis not present

## 2022-06-10 DIAGNOSIS — C50919 Malignant neoplasm of unspecified site of unspecified female breast: Secondary | ICD-10-CM | POA: Diagnosis not present

## 2022-06-10 DIAGNOSIS — N951 Menopausal and female climacteric states: Secondary | ICD-10-CM | POA: Diagnosis not present

## 2022-06-14 ENCOUNTER — Ambulatory Visit
Admission: RE | Admit: 2022-06-14 | Discharge: 2022-06-14 | Disposition: A | Discharge status: Home / Self Care | Payer: PPO | Source: Ambulatory Visit | Attending: Nurse Practitioner | Admitting: Nurse Practitioner

## 2022-06-14 DIAGNOSIS — M85852 Other specified disorders of bone density and structure, left thigh: Secondary | ICD-10-CM | POA: Diagnosis not present

## 2022-06-14 DIAGNOSIS — Z780 Asymptomatic menopausal state: Secondary | ICD-10-CM | POA: Diagnosis not present

## 2022-06-14 DIAGNOSIS — Z170 Estrogen receptor positive status [ER+]: Secondary | ICD-10-CM

## 2022-06-14 DIAGNOSIS — R92323 Mammographic fibroglandular density, bilateral breasts: Secondary | ICD-10-CM | POA: Diagnosis not present

## 2022-06-14 DIAGNOSIS — Z853 Personal history of malignant neoplasm of breast: Secondary | ICD-10-CM | POA: Diagnosis not present

## 2022-06-14 HISTORY — DX: Personal history of irradiation: Z92.3

## 2022-06-20 ENCOUNTER — Telehealth: Payer: Self-pay

## 2022-06-20 NOTE — Telephone Encounter (Addendum)
Relayed message below to patient, Patient voiced understanding   ----- Message from Alla Feeling, NP sent at 06/20/2022 11:03 AM EST ----- Please call pt, let her know DEXA shows mild osteopenia, but low fracture risk score. I recommend daily calcium and vitamin D, and weight bearing exercise.  Thanks, Regan Rakers, NP

## 2022-09-18 ENCOUNTER — Other Ambulatory Visit: Payer: Self-pay

## 2022-09-18 DIAGNOSIS — C50212 Malignant neoplasm of upper-inner quadrant of left female breast: Secondary | ICD-10-CM

## 2022-09-19 ENCOUNTER — Inpatient Hospital Stay: Payer: PPO

## 2022-09-19 ENCOUNTER — Inpatient Hospital Stay: Payer: PPO | Admitting: Nurse Practitioner

## 2022-09-26 IMAGING — CT CT CHEST LUNG CANCER SCREENING LOW DOSE W/O CM
2 of 4 series · 15 of 36 positions shown, 18 images · non-contrast
Comparison: None.

CLINICAL DATA: 75-year-old female with 25 pack-year history of
smoking. Lung cancer screening.

EXAM:
CT CHEST WITHOUT CONTRAST LOW-DOSE FOR LUNG CANCER SCREENING
TECHNIQUE: Multidetector CT imaging of the chest was performed following the
standard protocol without IV contrast.

[Series 3: lung thins 1.0 · axial · 0.59mm/px · z∈[-346,-32]mm · 12 of 346 slices shown, 15 images]
[im 16/346  mediastinal]
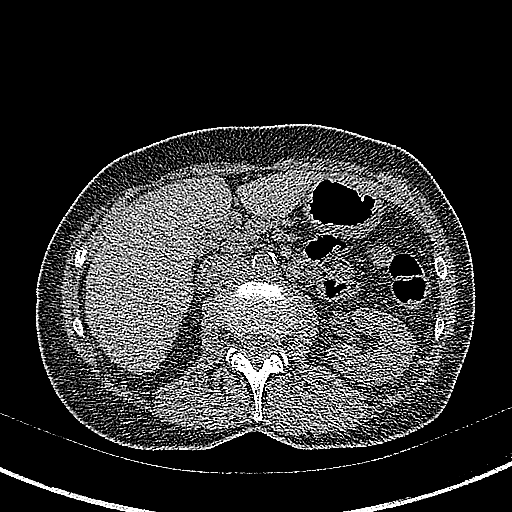
[im 16/346  lung]
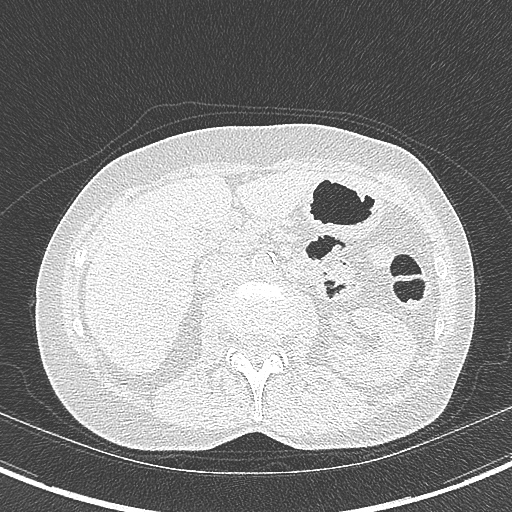
[im 48/346  lung]
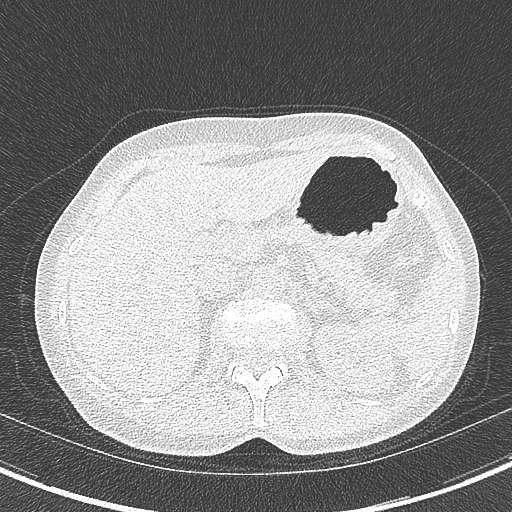
[im 79/346  lung]
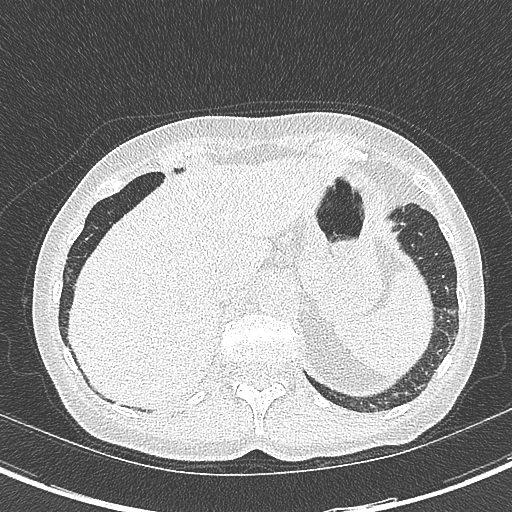
[im 110/346  lung]
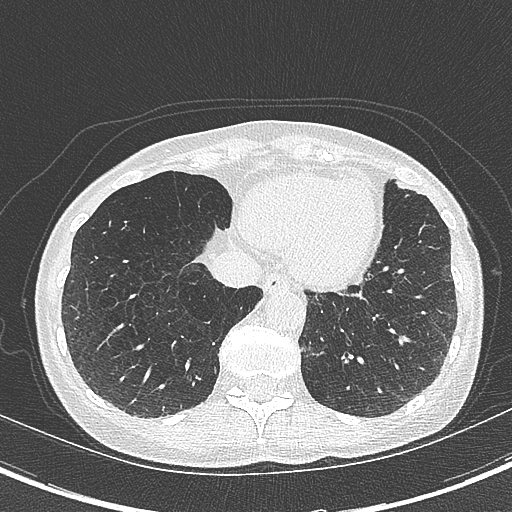
[im 126/346  mediastinal]
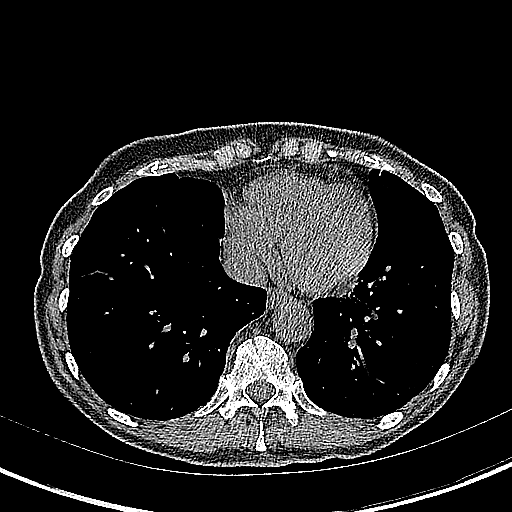
[im 126/346  lung]
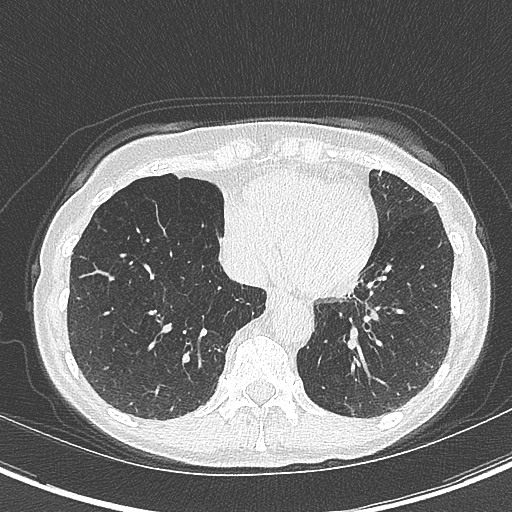
[im 157/346  lung]
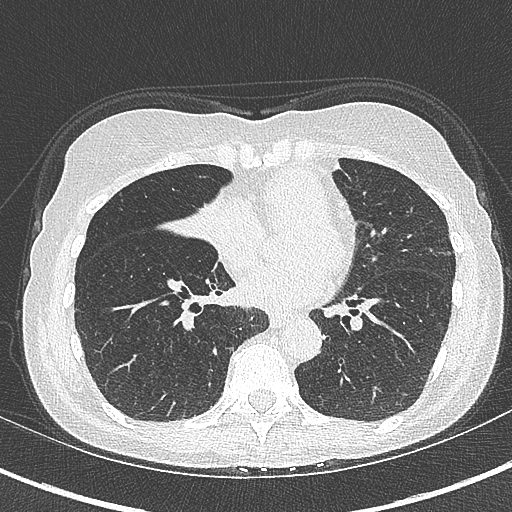
[im 189/346  lung]
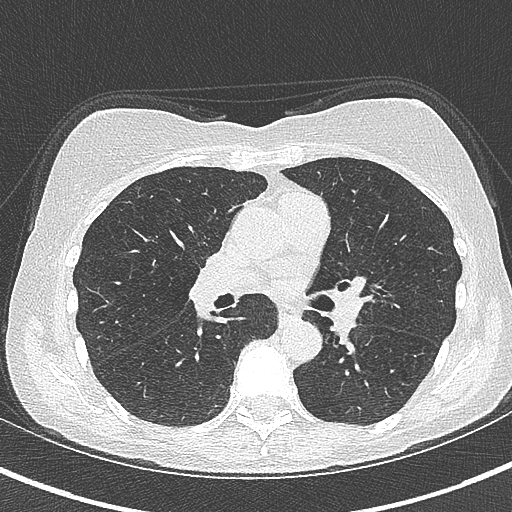
[im 220/346  lung]
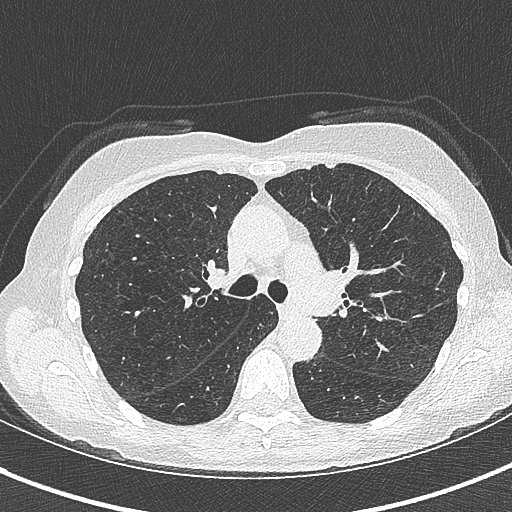
[im 236/346  mediastinal]
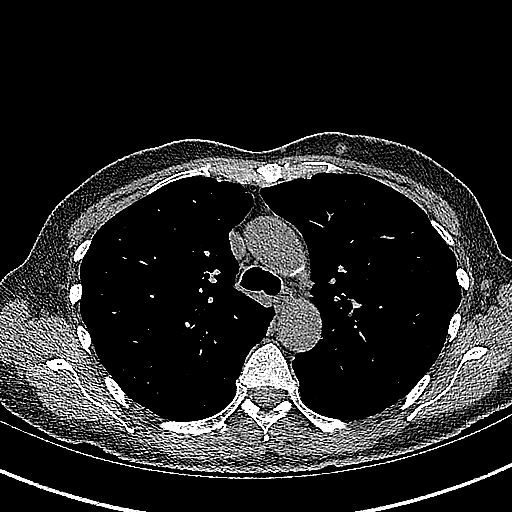
[im 236/346  lung]
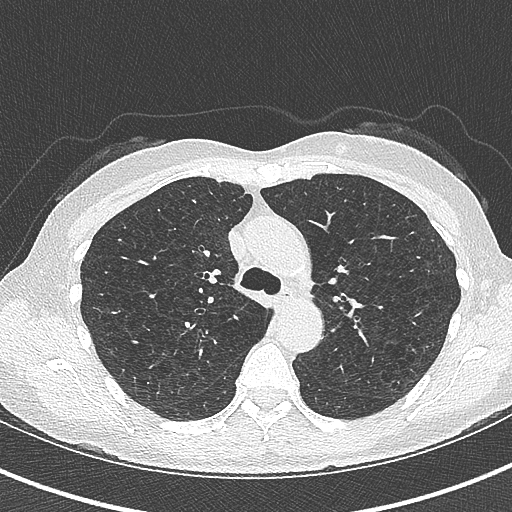
[im 267/346  lung]
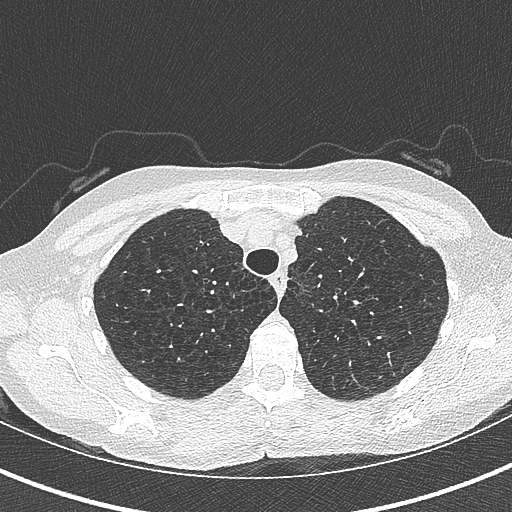
[im 298/346  lung]
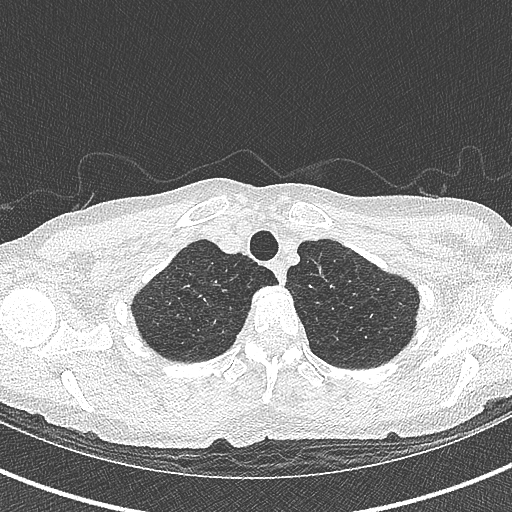
[im 330/346  lung]
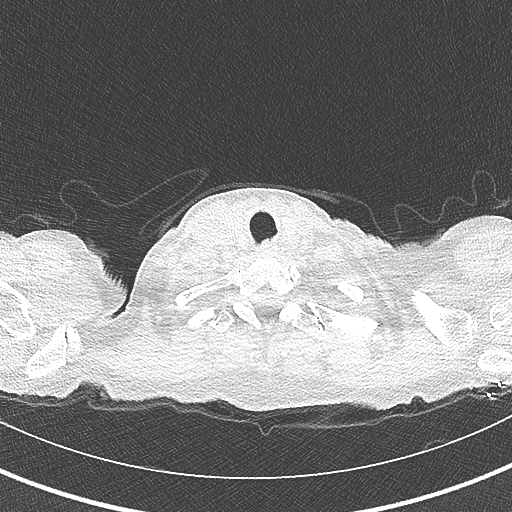

[Series 5: coronal · coronal · 0.56mm/px · 3 of 97 slices shown]
[im 20/97  lung]
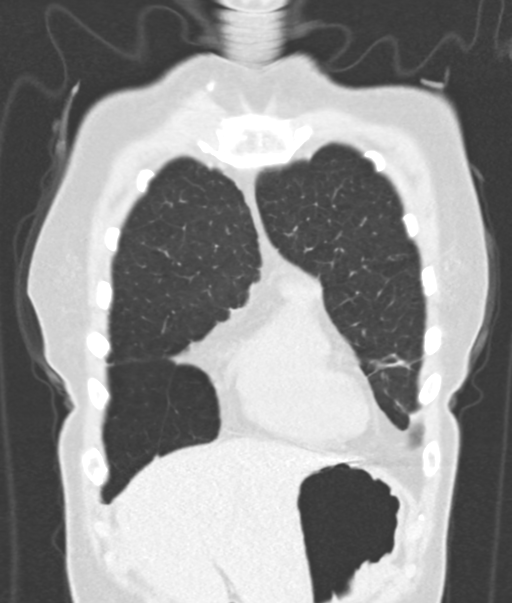
[im 39/97  lung]
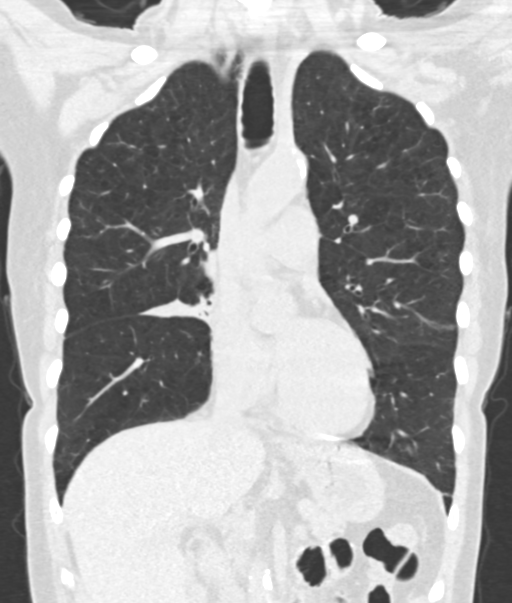
[im 58/97  lung]
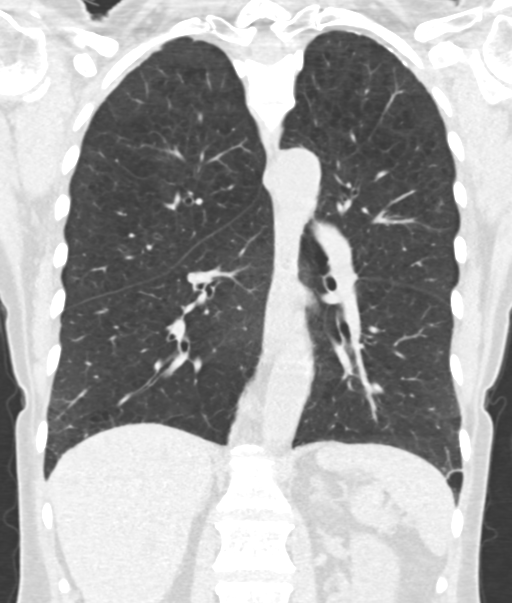

[15 of 36 positions shown; findings below may reference images not displayed]

FINDINGS: Cardiovascular: The heart size is normal. No substantial pericardial
effusion. Coronary artery calcification is evident. Atherosclerotic
calcification is noted in the wall of the thoracic aorta.

Mediastinum/Nodes: No mediastinal lymphadenopathy. No evidence for
gross hilar lymphadenopathy although assessment is limited by the
lack of intravenous contrast on today's study. The esophagus has
normal imaging features. There is no axillary lymphadenopathy.

Lungs/Pleura: Centrilobular and paraseptal emphysema evident.
Complete collapse of the right middle lobe evident. Subsegmental
atelectasis or linear scarring noted in the lingula. No suspicious
pulmonary nodule or mass within the aerated lung No focal airspace
consolidation. No pleural effusion.

Upper Abdomen: Unremarkable

Musculoskeletal: No worrisome lytic or sclerotic osseous
abnormality.
IMPRESSION: 1. Lung-RADS 0, incomplete. Right middle lobe cannot be assessed due
to complete right middle lobe collapse. No central obstructing mass
lesion evident on noncontrast CT imaging, but short-term follow-up
chest CT with contrast recommended to further evaluate.
2.  Emphysema (4L7F9-DPF.6) and Aortic Atherosclerosis (4L7F9-170.0)

## 2022-10-03 ENCOUNTER — Inpatient Hospital Stay: Payer: PPO | Attending: Nurse Practitioner

## 2022-10-03 ENCOUNTER — Encounter: Payer: Self-pay | Admitting: Nurse Practitioner

## 2022-10-03 ENCOUNTER — Inpatient Hospital Stay (HOSPITAL_BASED_OUTPATIENT_CLINIC_OR_DEPARTMENT_OTHER): Payer: PPO | Admitting: Nurse Practitioner

## 2022-10-03 VITALS — BP 135/56 | HR 66 | Temp 97.9°F | Resp 16 | Ht 64.0 in | Wt 103.8 lb

## 2022-10-03 DIAGNOSIS — Z170 Estrogen receptor positive status [ER+]: Secondary | ICD-10-CM | POA: Insufficient documentation

## 2022-10-03 DIAGNOSIS — M8580 Other specified disorders of bone density and structure, unspecified site: Secondary | ICD-10-CM | POA: Diagnosis not present

## 2022-10-03 DIAGNOSIS — Z79811 Long term (current) use of aromatase inhibitors: Secondary | ICD-10-CM | POA: Diagnosis not present

## 2022-10-03 DIAGNOSIS — C50212 Malignant neoplasm of upper-inner quadrant of left female breast: Secondary | ICD-10-CM | POA: Diagnosis not present

## 2022-10-03 DIAGNOSIS — N951 Menopausal and female climacteric states: Secondary | ICD-10-CM | POA: Diagnosis not present

## 2022-10-03 LAB — CBC WITH DIFFERENTIAL (CANCER CENTER ONLY)
Abs Immature Granulocytes: 0.03 10*3/uL (ref 0.00–0.07)
Basophils Absolute: 0 10*3/uL (ref 0.0–0.1)
Basophils Relative: 0 %
Eosinophils Absolute: 0.2 10*3/uL (ref 0.0–0.5)
Eosinophils Relative: 2 %
HCT: 37.3 % (ref 36.0–46.0)
Hemoglobin: 12.3 g/dL (ref 12.0–15.0)
Immature Granulocytes: 0 %
Lymphocytes Relative: 15 %
Lymphs Abs: 1.2 10*3/uL (ref 0.7–4.0)
MCH: 30.6 pg (ref 26.0–34.0)
MCHC: 33 g/dL (ref 30.0–36.0)
MCV: 92.8 fL (ref 80.0–100.0)
Monocytes Absolute: 0.7 10*3/uL (ref 0.1–1.0)
Monocytes Relative: 8 %
Neutro Abs: 5.8 10*3/uL (ref 1.7–7.7)
Neutrophils Relative %: 75 %
Platelet Count: 271 10*3/uL (ref 150–400)
RBC: 4.02 MIL/uL (ref 3.87–5.11)
RDW: 12.1 % (ref 11.5–15.5)
WBC Count: 7.9 10*3/uL (ref 4.0–10.5)
nRBC: 0 % (ref 0.0–0.2)

## 2022-10-03 LAB — CMP (CANCER CENTER ONLY)
ALT: 61 U/L — ABNORMAL HIGH (ref 0–44)
AST: 44 U/L — ABNORMAL HIGH (ref 15–41)
Albumin: 4.1 g/dL (ref 3.5–5.0)
Alkaline Phosphatase: 34 U/L — ABNORMAL LOW (ref 38–126)
Anion gap: 6 (ref 5–15)
BUN: 20 mg/dL (ref 8–23)
CO2: 30 mmol/L (ref 22–32)
Calcium: 10 mg/dL (ref 8.9–10.3)
Chloride: 104 mmol/L (ref 98–111)
Creatinine: 0.87 mg/dL (ref 0.44–1.00)
GFR, Estimated: >60 mL/min (ref >60)
Glucose, Bld: 137 mg/dL — ABNORMAL HIGH (ref 70–99)
Potassium: 4.2 mmol/L (ref 3.5–5.1)
Sodium: 140 mmol/L (ref 135–145)
Total Bilirubin: 0.8 mg/dL (ref 0.3–1.2)
Total Protein: 8.1 g/dL (ref 6.5–8.1)

## 2022-10-03 MED ORDER — EXEMESTANE 25 MG PO TABS: ORAL_TABLET | ORAL | 0 refills | Status: DC

## 2022-10-03 NOTE — Progress Notes (Signed)
Patient Care Team: Jillian Contras, MD as PCP - General (Family Medicine) Jillian Bookbinder, MD as Consulting Physician (General Surgery) Jillian Rudd, MD as Consulting Physician (Radiation Oncology) Jillian Merle, MD as Consulting Physician (Hematology) Jillian Germany, RN as Oncology Nurse Navigator Jillian Kaufmann, RN as Oncology Nurse Navigator Jillian Feeling, NP as Nurse Practitioner (Nurse Practitioner)   CHIEF COMPLAINT: Follow up left breast cancer   Oncology History Overview Note   Cancer Staging  Malignant neoplasm of upper-inner quadrant of left breast in female, estrogen receptor positive [facility]) Staging form: Breast, AJCC 8th Edition - Clinical stage from 05/23/2021: Stage IA (cT1c, cN0, cM0, G1, ER+, PR+, HER2-) - Signed by Jillian Merle, MD on 06/07/2021 - Pathologic stage from 06/27/2021: Stage Unknown (pT1b, pNX, cM0, G1, ER+, PR+, HER2-) - Signed by Jillian Merle, MD on 09/10/2021     Malignant neoplasm of upper-inner quadrant of left breast in female, estrogen receptor positive ([place])  05/13/2021 Mammogram   EXAM: DIGITAL DIAGNOSTIC UNILATERAL LEFT MAMMOGRAM WITH TOMOSYNTHESIS AND CAD;  ULTRASOUND LEFT BREAST LIMITED  IMPRESSION: 1. There is a highly suspicious 1.1 cm mass in the left breast at 10 o'clock.   2.  No evidence of left axillary lymphadenopathy.   05/23/2021 Cancer Staging   Staging form: Breast, AJCC 8th Edition - Clinical stage from 05/23/2021: Stage IA (cT1c, cN0, cM0, G1, ER+, PR+, HER2-) - Signed by Jillian Merle, MD on 06/07/2021 Stage prefix: Initial diagnosis Histologic grading system: 3 grade system   05/23/2021 Pathology Results   Diagnosis Breast, left, needle core biopsy, 10 o'clock 5 cmfn - INVASIVE MAMMARY CARCINOMA - MAMMARY CARCINOMA IN-SITU - SEE COMMENT Microscopic Comment The biopsy material shows an infiltrative proliferation of cells with arranged linearly and in small clusters. Based on the biopsy, the carcinoma appears Nottingham  grade 1-2 of 3 and measures 0.5 cm in greatest linear extent.  E-cadherin is negative supporting lobular origin.  PROGNOSTIC INDICATORS Results: The tumor cells are EQUIVOCAL for Her2 (2+). Her2 by FISH will be performed and results reported separately. Estrogen Receptor: 100%, POSITIVE, STRONG STAINING INTENSITY Progesterone Receptor: 100%, POSITIVE, STRONG STAINING INTENSITY Proliferation Marker Ki67: 5%  FLUORESCENCE IN-SITU HYBRIDIZATION Results: GROUP 5: HER2 **NEGATIVE**   06/07/2021 Initial Diagnosis   Malignant neoplasm of upper-inner quadrant of left breast in female, estrogen receptor positive ([place])   06/27/2021 Cancer Staging   Staging form: Breast, AJCC 8th Edition - Pathologic stage from 06/27/2021: Stage Unknown (pT1b, pNX, cM0, G1, ER+, PR+, HER2-) - Signed by Jillian Merle, MD on 09/10/2021 Stage prefix: Initial diagnosis Multigene prognostic tests performed: None Histologic grading system: 3 grade system   06/27/2021 Definitive Surgery   FINAL MICROSCOPIC DIAGNOSIS:   A. BREAST, LEFT, LUMPECTOMY:  -  Invasive lobular carcinoma, Nottingham grade 1 of 3, 0.9 cm  -  Margins uninvolved by carcinoma (<0.1 cm; posterior margin)  -  Previous biopsy site changes present  -  See oncology table and comment below  Comment(s): Cytokeratin AE1/3 highlights the presence of invasive carcinoma in proximity to the inked posterior margin.    12/14/2021 Survivorship   SCP delivered by Jillian Tyler      CURRENT THERAPY: Exemestane, starting 09/2021  INTERVAL HISTORY Jillian Tyler returns for follow up as scheduled. Last seen by Dr. Burr Medico 03/15/22. DEXA 06/14/22 showed mild osteopenia T score -1.2 at LFN, low frax score. Mammogram same day was benign. She continues exemestane, tolerating better.  Morning joint stiffness resolves with movement.  Hot flashes have improved and  headaches essentially resolved.  Her 2 grandchildren and 1 great granddaughter live with her, grandson had a febrile  illness that she got from him in February.  Symptoms included low-grade fever, headache, fatigue, GI upset, and loss of appetite.  She is gradually recovering, still tired.  Denies breast concerns such as new lump/mass, nipple discharge or inversion, skin change.   ROS  All other systems reviewed and negative  Past Medical History:  Diagnosis Date   Allergy    Breast cancer [facility])    Personal history of radiation therapy      Past Surgical History:  Procedure Laterality Date   BREAST BIOPSY Left 05/23/2021   BREAST CYST ASPIRATION Right    20 + yrs ago   BREAST LUMPECTOMY Left 06/27/2021   BREAST LUMPECTOMY WITH RADIOACTIVE SEED LOCALIZATION Left 06/27/2021   Procedure: LEFT BREAST LUMPECTOMY WITH RADIOACTIVE SEED LOCALIZATION;  Surgeon: Jillian Bookbinder, MD;  Location: [facility];  Service: General;  Laterality: Left;     Outpatient Encounter Medications as of 10/03/2022  Medication Sig Note   acetaminophen (TYLENOL) 500 MG tablet Take 500 mg by mouth every 6 (six) hours as needed (headache). 06/17/2021: seldom   Cholecalciferol (VITAMIN D3 PO) Take 1 tablet by mouth every evening.    Multiple Vitamin (MULTIVITAMIN WITH MINERALS) TABS tablet Take 1 tablet by mouth every evening. Centrum Multivitamin    Omega-3 Fatty Acids (FISH OIL PO) Take 1 capsule by mouth daily after supper.    traZODone (DESYREL) 100 MG tablet Take 100 mg by mouth at bedtime.    [DISCONTINUED] exemestane (AROMASIN) 25 MG tablet TAKE 1 TABLET BY MOUTH DAILY AFTER BREAKFAST.    exemestane (AROMASIN) 25 MG tablet TAKE 1 TABLET BY MOUTH DAILY AFTER BREAKFAST.    No facility-administered encounter medications on file as of 10/03/2022.     Today's Vitals   10/03/22 1130  BP: (!) 135/56  Pulse: 66  Resp: 16  Temp: 97.9 F (36.6 C)  TempSrc: Oral  SpO2: 94%  Weight: 103 lb 12.8 oz (47.1 kg)  Height: '5\' 4"'$  (1.626 m)   Body mass index is 17.82 kg/m.   PHYSICAL EXAM GENERAL:alert, no distress and  comfortable SKIN: no rash  EYES: sclera clear NECK: without mass LYMPH:  no palpable cervical or supraclavicular lymphadenopathy  LUNGS: clear with normal breathing effort HEART: regular rate & rhythm, no lower extremity edema ABDOMEN: abdomen soft, non-tender and normal bowel sounds NEURO: alert & oriented x 3 with fluent speech Breast exam: Symmetrical without nipple discharge or inversion.  S/p left lumpectomy, incisions completely healed with minimal scar tissue.  No palpable mass or nodularity in either breast or axilla that I could appreciate.    CBC    Component Value Date/Time   WBC 7.9 10/03/2022 1041   WBC 7.2 03/15/2022 1232   RBC 4.02 10/03/2022 1041   HGB 12.3 10/03/2022 1041   HCT 37.3 10/03/2022 1041   PLT 271 10/03/2022 1041   MCV 92.8 10/03/2022 1041   MCH 30.6 10/03/2022 1041   MCHC 33.0 10/03/2022 1041   RDW 12.1 10/03/2022 1041   LYMPHSABS 1.2 10/03/2022 1041   MONOABS 0.7 10/03/2022 1041   EOSABS 0.2 10/03/2022 1041   BASOSABS 0.0 10/03/2022 1041     CMP     Component Value Date/Time   NA 140 10/03/2022 1041   K 4.2 10/03/2022 1041   CL 104 10/03/2022 1041   CO2 30 10/03/2022 1041   GLUCOSE 137 (H) 10/03/2022 1041   BUN  20 10/03/2022 1041   CREATININE 0.87 10/03/2022 1041   CALCIUM 10.0 10/03/2022 1041   PROT 8.1 10/03/2022 1041   ALBUMIN 4.1 10/03/2022 1041   AST 44 (H) 10/03/2022 1041   ALT 61 (H) 10/03/2022 1041   ALKPHOS 34 (L) 10/03/2022 1041   BILITOT 0.8 10/03/2022 1041   GFRNONAA >60 10/03/2022 1041     ASSESSMENT & PLAN:Antwonette L Krohn is a 77 y.o. post-menopausal female with    1. Malignant neoplasm of upper-inner quadrant of left breast, invasive lobular carcinoma, stage IA, p(T1b, N0), ER+/PR+/HER2-, Grade 1-2 -found on screening mammogram. S/p left lumpectomy on 06/27/21 with Dr. Donne Hazel showed 0.9 cm ILC, grade 1. Margins close but uninvolved. -s/p radiation under Dr. Lisbeth Renshaw 08/17/21 - 09/14/21. -she started exemestane in  09/2021, plan for 7 years due to lobular histology -Mammogram 06/14/2022 showed breast density category B, no concern for malignancy -This like is clinically doing well from a breast cancer standpoint.  Tolerance to exemestane has improved.  Breast exam is benign, CBC is normal.   -CMP with slightly progressive transaminitis, normal T. bili.  She does not use much Tylenol or drink any alcohol, not on statins.  She recently had a febrile GI illness which is likely the cause.  Previous abdominal imaging 2020 showed no fatty liver or focal lesion.  I recommend to repeat LFTs in 4-8 weeks.  If persistently elevated or worsened will obtain abdominal ultrasound -Otherwise doing well with no clinical concern for recurrence. -Continue breast cancer surveillance and exemestane - follow-up in 6 months, or sooner if needed   2. Hot flashes, Headaches -secondary to exemestane -Hot flashes have improved and headaches resolved   3. Bone Health  -Her most recent DEXA was normal, per pt. -DEXA 06/14/2022 showed osteopenia at the LFN, T-score -1.2 with low FRAX score -Recommend to optimize calcium, vitamin D, and weightbearing exercise    PLAN: -Recent mammogram, DEXA, and today's labs reviewed -Repeat LFTs in 4-8 weeks, if persistently elevated or worsened will obtain abdominal ultrasound.  Will call her with results -Continue breast cancer surveillance and exemestane, filled with good Rx coupon -Follow-up in 6 months, or sooner if needed   Orders Placed This Encounter  Procedures   CMP ([place] only)    Standing Status:   Future    Standing Expiration Date:   10/03/2023      All questions were answered. The patient knows to call the clinic with any problems, questions or concerns. No barriers to learning were detected. I spent 20 minutes counseling the patient face to face. The total time spent in the appointment was 30 minutes and more than 50% was on counseling, review of test results, and  coordination of care.   Jillian Rue, NP-C 10/03/2022

## 2022-10-13 ENCOUNTER — Other Ambulatory Visit: Payer: Self-pay

## 2022-10-13 MED ORDER — EXEMESTANE 25 MG PO TABS: ORAL_TABLET | ORAL | 0 refills | Status: DC

## 2022-10-17 ENCOUNTER — Encounter (HOSPITAL_COMMUNITY): Payer: Self-pay

## 2022-10-17 ENCOUNTER — Other Ambulatory Visit: Payer: Self-pay

## 2022-10-17 ENCOUNTER — Emergency Department (HOSPITAL_COMMUNITY)
Admission: EM | Admit: 2022-10-17 | Discharge: 2022-10-18 | Disposition: A | Discharge status: Home / Self Care | Payer: PPO | Attending: Emergency Medicine | Admitting: Emergency Medicine

## 2022-10-17 DIAGNOSIS — J9811 Atelectasis: Secondary | ICD-10-CM | POA: Diagnosis not present

## 2022-10-17 DIAGNOSIS — R059 Cough, unspecified: Secondary | ICD-10-CM | POA: Diagnosis not present

## 2022-10-17 DIAGNOSIS — D72829 Elevated white blood cell count, unspecified: Secondary | ICD-10-CM | POA: Insufficient documentation

## 2022-10-17 DIAGNOSIS — L509 Urticaria, unspecified: Secondary | ICD-10-CM

## 2022-10-17 DIAGNOSIS — T782XXA Anaphylactic shock, unspecified, initial encounter: Secondary | ICD-10-CM | POA: Diagnosis not present

## 2022-10-17 DIAGNOSIS — L500 Allergic urticaria: Secondary | ICD-10-CM | POA: Diagnosis not present

## 2022-10-17 DIAGNOSIS — R55 Syncope and collapse: Secondary | ICD-10-CM | POA: Insufficient documentation

## 2022-10-17 DIAGNOSIS — Z91040 Latex allergy status: Secondary | ICD-10-CM | POA: Diagnosis not present

## 2022-10-17 DIAGNOSIS — Z853 Personal history of malignant neoplasm of breast: Secondary | ICD-10-CM | POA: Diagnosis not present

## 2022-10-17 DIAGNOSIS — T7840XA Allergy, unspecified, initial encounter: Secondary | ICD-10-CM | POA: Diagnosis not present

## 2022-10-17 DIAGNOSIS — R0902 Hypoxemia: Secondary | ICD-10-CM | POA: Diagnosis not present

## 2022-10-17 DIAGNOSIS — L299 Pruritus, unspecified: Secondary | ICD-10-CM | POA: Diagnosis not present

## 2022-10-17 NOTE — ED Provider Notes (Signed)
[place] Provider Note   CSN: [id number] Arrival date & time: 10/17/22  2329     History  Chief Complaint  Patient presents with   Allergic Reaction    Pt bib ems with c/o allergic reaction. Pt states she ate dinner, showered and when sitting down to have her coffee she started breaking out in hives on her torso with nausea. Allergic to Augmentin. No changes in foods, soaps etc. Pt given 50mg  benadryl IV by EMS. Pt reports improvement with no other s/s at this time     Jillian Tyler is a 77 y.o. female.  The history is provided by the patient and medical records.  Allergic Reaction Jillian Tyler is a 77 y.o. female who presents to the Emergency Department complaining of rash.  She presents to the emergency department by EMS for evaluation of an itchy rash that started after she did soak a bath at 945.  She got hot all over and felt like he was going to pass out and had some mild shortness of breath.  No similar prior symptoms.  No new medications, foods, detergents.  She has had some sinus congestion for about a week.  EMS treated her with 50 mg of IV Benadryl and states that her symptoms are significantly improved at time of ED presentation.  She does have a history of breast cancer, no additional medical problems.        Home Medications Prior to Admission medications   Medication Sig Start Date End Date Taking? Authorizing Provider  acetaminophen (TYLENOL) 500 MG tablet Take 500 mg by mouth every 6 (six) hours as needed (headache).   Yes [provider]  Cholecalciferol (VITAMIN D3 PO) Take 1 tablet by mouth every evening.   Yes [provider]  exemestane (AROMASIN) 25 MG tablet TAKE 1 TABLET BY MOUTH DAILY AFTER BREAKFAST. 10/13/22  Yes Truitt Merle, MD  Multiple Vitamin (MULTIVITAMIN WITH MINERALS) TABS tablet Take 1 tablet by mouth every evening. Centrum Multivitamin   Yes [provider]  Omega-3 Fatty  Acids (FISH OIL PO) Take 1 capsule by mouth daily after supper.   Yes [provider]  traZODone (DESYREL) 100 MG tablet Take 100 mg by mouth at bedtime. 08/08/20  Yes [provider]      Allergies    Hydrocod poli-chlorphe poli er, Influenza vaccines, Latex, and Augmentin [amoxicillin-pot clavulanate]    Review of Systems   Review of Systems  All other systems reviewed and are negative.   Physical Exam Updated Vital Signs BP 124/62   Pulse 63   Temp 98.2 F (36.8 C)   Resp 17   SpO2 90%  Physical Exam Vitals and nursing note reviewed.  Constitutional:      Appearance: She is well-developed.  HENT:     Head: Normocephalic and atraumatic.  Cardiovascular:     Rate and Rhythm: Normal rate and regular rhythm.     Heart sounds: No murmur heard. Pulmonary:     Effort: Pulmonary effort is normal. No respiratory distress.     Breath sounds: Normal breath sounds.  Abdominal:     Palpations: Abdomen is soft.     Tenderness: There is no abdominal tenderness. There is no guarding or rebound.  Musculoskeletal:        General: No tenderness.  Skin:    General: Skin is warm and dry.     Comments: Scattered urticaria to trunk  Neurological:  Mental Status: She is alert and oriented to person, place, and time.  Psychiatric:        Behavior: Behavior normal.     ED Results / Procedures / Treatments   Labs (all labs ordered are listed, but only abnormal results are displayed) Labs Reviewed  COMPREHENSIVE METABOLIC PANEL - Abnormal; Notable for the following components:      Result Value   Glucose, Bld 119 (*)    Creatinine, Ser 1.08 (*)    Calcium 8.6 (*)    Albumin 3.4 (*)    ALT 45 (*)    Alkaline Phosphatase 31 (*)    GFR, Estimated 53 (*)    All other components within normal limits  CBC WITH DIFFERENTIAL/PLATELET - Abnormal; Notable for the following components:   WBC 11.0 (*)    RBC 3.79 (*)    Hemoglobin 11.6 (*)    Neutro Abs 9.0 (*)    All  other components within normal limits    EKG None  Radiology DG Chest Port 1 View  Result Date: 10/18/2022 CLINICAL DATA:  Cough EXAM: PORTABLE CHEST 1 VIEW COMPARISON:  08/25/2008 FINDINGS: Mild right basilar atelectasis. Lungs are otherwise clear. No pneumothorax or pleural effusion. Cardiac size within normal limits. Pulmonary vascularity is normal. No acute bone abnormality. IMPRESSION: 1. Mild right basilar atelectasis. Electronically Signed   By: Fidela Salisbury M.D.   On: 10/18/2022 00:33    Procedures Procedures    Medications Ordered in ED Medications - No data to display  ED Course/ Medical Decision Making/ A&P                             Medical Decision Making Amount and/or Complexity of Data Reviewed Labs: ordered. Radiology: ordered.   Patient here for evaluation of rash, did have a near syncopal episode.  Patient with resolving hives at time of ED presentation.  During her ED stay these resolved.  Her symptoms have completely resolved at time of ED reassessment.  CBC with mild leukocytosis, no definite evidence of infection.  BMP with mild elevation in creatinine compared to baseline-patient does report decreased oral intake.  No definite triggers for this episode today.  No definite anaphylaxis although she did have a near syncopal episode at home.  She was observed for several hours in the emergency department without recurrent symptoms and complete resolution of her prior symptoms.  Discussed with patient unclear source of her urticaria.  Discussed close PCP follow-up as well as return precautions.       Final Clinical Impression(s) / ED Diagnoses Final diagnoses:  Urticaria    Rx / DC Orders ED Discharge Orders     None         Quintella Reichert, MD 10/18/22 [phone]

## 2022-10-18 ENCOUNTER — Emergency Department (HOSPITAL_COMMUNITY): Payer: PPO

## 2022-10-18 DIAGNOSIS — J9811 Atelectasis: Secondary | ICD-10-CM | POA: Diagnosis not present

## 2022-10-18 DIAGNOSIS — R059 Cough, unspecified: Secondary | ICD-10-CM | POA: Diagnosis not present

## 2022-10-18 LAB — COMPREHENSIVE METABOLIC PANEL
ALT: 45 U/L — ABNORMAL HIGH (ref 0–44)
AST: 39 U/L (ref 15–41)
Albumin: 3.4 g/dL — ABNORMAL LOW (ref 3.5–5.0)
Alkaline Phosphatase: 31 U/L — ABNORMAL LOW (ref 38–126)
Anion gap: 5 (ref 5–15)
BUN: 18 mg/dL (ref 8–23)
CO2: 25 mmol/L (ref 22–32)
Calcium: 8.6 mg/dL — ABNORMAL LOW (ref 8.9–10.3)
Chloride: 107 mmol/L (ref 98–111)
Creatinine, Ser: 1.08 mg/dL — ABNORMAL HIGH (ref 0.44–1.00)
GFR, Estimated: 53 mL/min — ABNORMAL LOW (ref >60)
Glucose, Bld: 119 mg/dL — ABNORMAL HIGH (ref 70–99)
Potassium: 3.9 mmol/L (ref 3.5–5.1)
Sodium: 137 mmol/L (ref 135–145)
Total Bilirubin: 0.5 mg/dL (ref 0.3–1.2)
Total Protein: 7.3 g/dL (ref 6.5–8.1)

## 2022-10-18 LAB — CBC WITH DIFFERENTIAL/PLATELET
Abs Immature Granulocytes: 0.04 10*3/uL (ref 0.00–0.07)
Basophils Absolute: 0 10*3/uL (ref 0.0–0.1)
Basophils Relative: 0 %
Eosinophils Absolute: 0.1 10*3/uL (ref 0.0–0.5)
Eosinophils Relative: 1 %
HCT: 36 % (ref 36.0–46.0)
Hemoglobin: 11.6 g/dL — ABNORMAL LOW (ref 12.0–15.0)
Immature Granulocytes: 0 %
Lymphocytes Relative: 9 %
Lymphs Abs: 1 10*3/uL (ref 0.7–4.0)
MCH: 30.6 pg (ref 26.0–34.0)
MCHC: 32.2 g/dL (ref 30.0–36.0)
MCV: 95 fL (ref 80.0–100.0)
Monocytes Absolute: 0.8 10*3/uL (ref 0.1–1.0)
Monocytes Relative: 7 %
Neutro Abs: 9 10*3/uL — ABNORMAL HIGH (ref 1.7–7.7)
Neutrophils Relative %: 83 %
Platelets: 235 10*3/uL (ref 150–400)
RBC: 3.79 MIL/uL — ABNORMAL LOW (ref 3.87–5.11)
RDW: 12.4 % (ref 11.5–15.5)
WBC: 11 10*3/uL — ABNORMAL HIGH (ref 4.0–10.5)
nRBC: 0 % (ref 0.0–0.2)

## 2022-10-18 NOTE — Discharge Instructions (Signed)
The cause of your symptoms was not identified today.  You can take Zyrtec, available over the counter according to label instructions.

## 2022-10-18 NOTE — ED Notes (Signed)
Pt resting in room at this time, pt continuing to have improvement of s/s at this time

## 2022-10-23 ENCOUNTER — Telehealth: Payer: Self-pay

## 2022-10-23 NOTE — Telephone Encounter (Signed)
Pt called the office and wanted to make Sheridan NP aware of an episode she encounter on Tuesday 10/17/2022. Pt stated when she got out of the shower that night she started experiencing itching, burning, whips over her body  and SOB. Pt called EMS and the treated her with 50 mg benadryl. She stated EMS suggested that she go to ER for further evaluation Pt states that she thinks it coming from the antiestrogen Exemestane that she is on. Pt stated that she needed to let some one at the office know. I told her I will note this in her chart and if sh has any questions to call the office. Pt verbalized understanding.    Michon Kaczmarek M. CMA

## 2022-10-24 ENCOUNTER — Encounter: Payer: Self-pay | Admitting: Nurse Practitioner

## 2022-11-28 ENCOUNTER — Inpatient Hospital Stay: Payer: PPO | Attending: Nurse Practitioner

## 2022-11-28 DIAGNOSIS — Z170 Estrogen receptor positive status [ER+]: Secondary | ICD-10-CM | POA: Insufficient documentation

## 2022-11-28 DIAGNOSIS — C50212 Malignant neoplasm of upper-inner quadrant of left female breast: Secondary | ICD-10-CM | POA: Diagnosis not present

## 2022-11-28 LAB — CMP (CANCER CENTER ONLY)
ALT: 25 U/L (ref 0–44)
AST: 24 U/L (ref 15–41)
Albumin: 4.3 g/dL (ref 3.5–5.0)
Alkaline Phosphatase: 35 U/L — ABNORMAL LOW (ref 38–126)
Anion gap: 5 (ref 5–15)
BUN: 18 mg/dL (ref 8–23)
CO2: 32 mmol/L (ref 22–32)
Calcium: 10.2 mg/dL (ref 8.9–10.3)
Chloride: 99 mmol/L (ref 98–111)
Creatinine: 0.84 mg/dL (ref 0.44–1.00)
GFR, Estimated: >60 mL/min (ref >60)
Glucose, Bld: 141 mg/dL — ABNORMAL HIGH (ref 70–99)
Potassium: 3.9 mmol/L (ref 3.5–5.1)
Sodium: 136 mmol/L (ref 135–145)
Total Bilirubin: 0.6 mg/dL (ref 0.3–1.2)
Total Protein: 8.1 g/dL (ref 6.5–8.1)

## 2022-11-28 LAB — CBC WITH DIFFERENTIAL (CANCER CENTER ONLY)
Abs Immature Granulocytes: 0.01 10*3/uL (ref 0.00–0.07)
Basophils Absolute: 0 10*3/uL (ref 0.0–0.1)
Basophils Relative: 1 %
Eosinophils Absolute: 0.1 10*3/uL (ref 0.0–0.5)
Eosinophils Relative: 2 %
HCT: 38.5 % (ref 36.0–46.0)
Hemoglobin: 13 g/dL (ref 12.0–15.0)
Immature Granulocytes: 0 %
Lymphocytes Relative: 20 %
Lymphs Abs: 1.3 10*3/uL (ref 0.7–4.0)
MCH: 31.3 pg (ref 26.0–34.0)
MCHC: 33.8 g/dL (ref 30.0–36.0)
MCV: 92.8 fL (ref 80.0–100.0)
Monocytes Absolute: 0.5 10*3/uL (ref 0.1–1.0)
Monocytes Relative: 8 %
Neutro Abs: 4.4 10*3/uL (ref 1.7–7.7)
Neutrophils Relative %: 69 %
Platelet Count: 262 10*3/uL (ref 150–400)
RBC: 4.15 MIL/uL (ref 3.87–5.11)
RDW: 12.6 % (ref 11.5–15.5)
WBC Count: 6.3 10*3/uL (ref 4.0–10.5)
nRBC: 0 % (ref 0.0–0.2)

## 2023-01-14 ENCOUNTER — Other Ambulatory Visit: Payer: Self-pay | Admitting: Nurse Practitioner

## 2023-04-04 ENCOUNTER — Inpatient Hospital Stay: Payer: PPO | Attending: Hematology | Admitting: Hematology

## 2023-04-04 ENCOUNTER — Inpatient Hospital Stay: Payer: PPO

## 2023-04-04 VITALS — BP 123/50 | HR 59 | Temp 98.0°F | Resp 18 | Ht 64.0 in | Wt 106.2 lb

## 2023-04-04 DIAGNOSIS — Z170 Estrogen receptor positive status [ER+]: Secondary | ICD-10-CM | POA: Diagnosis not present

## 2023-04-04 DIAGNOSIS — M8588 Other specified disorders of bone density and structure, other site: Secondary | ICD-10-CM | POA: Diagnosis not present

## 2023-04-04 DIAGNOSIS — N951 Menopausal and female climacteric states: Secondary | ICD-10-CM | POA: Insufficient documentation

## 2023-04-04 DIAGNOSIS — Z79811 Long term (current) use of aromatase inhibitors: Secondary | ICD-10-CM | POA: Diagnosis not present

## 2023-04-04 DIAGNOSIS — Z79899 Other long term (current) drug therapy: Secondary | ICD-10-CM | POA: Insufficient documentation

## 2023-04-04 DIAGNOSIS — Z923 Personal history of irradiation: Secondary | ICD-10-CM | POA: Diagnosis not present

## 2023-04-04 DIAGNOSIS — Z853 Personal history of malignant neoplasm of breast: Secondary | ICD-10-CM | POA: Diagnosis not present

## 2023-04-04 DIAGNOSIS — C50212 Malignant neoplasm of upper-inner quadrant of left female breast: Secondary | ICD-10-CM | POA: Diagnosis not present

## 2023-04-04 DIAGNOSIS — M8580 Other specified disorders of bone density and structure, unspecified site: Secondary | ICD-10-CM | POA: Insufficient documentation

## 2023-04-04 LAB — CMP (CANCER CENTER ONLY)
ALT: 16 U/L (ref 0–44)
AST: 21 U/L (ref 15–41)
Albumin: 4.3 g/dL (ref 3.5–5.0)
Alkaline Phosphatase: 36 U/L — ABNORMAL LOW (ref 38–126)
Anion gap: 5 (ref 5–15)
BUN: 15 mg/dL (ref 8–23)
CO2: 31 mmol/L (ref 22–32)
Calcium: 9.7 mg/dL (ref 8.9–10.3)
Chloride: 103 mmol/L (ref 98–111)
Creatinine: 0.79 mg/dL (ref 0.44–1.00)
GFR, Estimated: >60 mL/min (ref >60)
Glucose, Bld: 122 mg/dL — ABNORMAL HIGH (ref 70–99)
Potassium: 4.6 mmol/L (ref 3.5–5.1)
Sodium: 139 mmol/L (ref 135–145)
Total Bilirubin: 0.6 mg/dL (ref 0.3–1.2)
Total Protein: 8 g/dL (ref 6.5–8.1)

## 2023-04-04 LAB — CBC WITH DIFFERENTIAL (CANCER CENTER ONLY)
Abs Immature Granulocytes: 0.01 10*3/uL (ref 0.00–0.07)
Basophils Absolute: 0.1 10*3/uL (ref 0.0–0.1)
Basophils Relative: 1 %
Eosinophils Absolute: 0.1 10*3/uL (ref 0.0–0.5)
Eosinophils Relative: 2 %
HCT: 36.2 % (ref 36.0–46.0)
Hemoglobin: 12.2 g/dL (ref 12.0–15.0)
Immature Granulocytes: 0 %
Lymphocytes Relative: 24 %
Lymphs Abs: 1.4 10*3/uL (ref 0.7–4.0)
MCH: 31.9 pg (ref 26.0–34.0)
MCHC: 33.7 g/dL (ref 30.0–36.0)
MCV: 94.8 fL (ref 80.0–100.0)
Monocytes Absolute: 0.5 10*3/uL (ref 0.1–1.0)
Monocytes Relative: 9 %
Neutro Abs: 3.7 10*3/uL (ref 1.7–7.7)
Neutrophils Relative %: 64 %
Platelet Count: 240 10*3/uL (ref 150–400)
RBC: 3.82 MIL/uL — ABNORMAL LOW (ref 3.87–5.11)
RDW: 12.8 % (ref 11.5–15.5)
WBC Count: 5.7 10*3/uL (ref 4.0–10.5)
nRBC: 0 % (ref 0.0–0.2)

## 2023-04-04 MED ORDER — EXEMESTANE 25 MG PO TABS: ORAL_TABLET | ORAL | 1 refills | Status: DC

## 2023-04-04 MED ORDER — GABAPENTIN 100 MG PO CAPS: 100.0000 mg | ORAL_CAPSULE | Freq: Every day | ORAL | 1 refills | Status: DC

## 2023-04-04 NOTE — Assessment & Plan Note (Signed)
-  Her bone density scan from November 2023 showed osteopenia with T-score -1.2 in left hip, no high risk features. -Continue calcium and vitamin D supplement -Discussed exemestane may have negative impact on her bone density, will monitor closely with repeat scan every 2 years.

## 2023-04-04 NOTE — Assessment & Plan Note (Signed)
invasive lobular carcinoma, stage IA, p(T1b, N0), ER+/PR+/HER2-, Grade 1-2 -found on screening mammogram. S/p left lumpectomy on 06/27/21 with Dr. Dwain Sarna showed 0.9 cm ILC, grade 1. Margins close but uninvolved. -s/p radiation under Dr. Mitzi Hansen 08/17/21 - 09/14/21. -she started exemestane in 09/2021, plan for 7 years due to lobular cancer. She is tolerating moderately well with intense hot flashes, and more frequent headaches.

## 2023-04-04 NOTE — Progress Notes (Signed)
[facility]   [phone] [phone]   Clinic Follow up Note   Patient Care Team: Tally Joe, MD as PCP - General (Family Medicine) Emelia Loron, MD as Consulting Physician (General Surgery) Dorothy Puffer, MD as Consulting Physician (Radiation Oncology) Malachy Mood, MD as Consulting Physician (Hematology) Donnelly Angelica, RN as Oncology Nurse Navigator Pershing Proud, RN as Oncology Nurse Navigator Pollyann Samples, NP as Nurse Practitioner (Nurse Practitioner)  Date of Service:  04/04/2023  CHIEF COMPLAINT: f/u of left breast cancer   CURRENT THERAPY:  Exemestane, starting 09/2021  ASSESSMENT:  Jillian Tyler is a 77 y.o. female with   Malignant neoplasm of upper-inner quadrant of left breast in female, estrogen receptor positive (HCC)  invasive lobular carcinoma, stage IA, p(T1b, N0), ER+/PR+/HER2-, Grade 1-2 -found on screening mammogram. S/p left lumpectomy on 06/27/21 with Dr. Dwain Sarna showed 0.9 cm ILC, grade 1. Margins close but uninvolved. -s/p radiation under Dr. Mitzi Hansen 08/17/21 - 09/14/21. -she started exemestane in 09/2021, plan for 7 years due to lobular cancer. She is tolerating moderately well with intense hot flashes, and more frequent headaches.  Osteopenia -Her bone density scan from November 2023 showed osteopenia with T-score -1.2 in left hip, no high risk features. -Continue calcium and vitamin D supplement -Discussed exemestane may have negative impact on her bone density, will monitor closely with repeat scan every 2 years.    PLAN: - Lab reviewed - I recommend Gabapentin to regulate her hot flashes - I prescribe Gabapentin 100 mg  - I refill Exemestane - I order Screening mammogram for 11/16 -lab and f/u in 6 months with NP Lacie  SUMMARY OF ONCOLOGIC HISTORY: Oncology History Overview Note   Cancer Staging  Malignant neoplasm of upper-inner quadrant of left breast in female, estrogen receptor positive  (HCC) Staging form: Breast, AJCC 8th Edition - Clinical stage from 05/23/2021: Stage IA (cT1c, cN0, cM0, G1, ER+, PR+, HER2-) - Signed by Malachy Mood, MD on 06/07/2021 - Pathologic stage from 06/27/2021: Stage Unknown (pT1b, pNX, cM0, G1, ER+, PR+, HER2-) - Signed by Malachy Mood, MD on 09/10/2021     Malignant neoplasm of upper-inner quadrant of left breast in female, estrogen receptor positive (HCC)  05/13/2021 Mammogram   EXAM: DIGITAL DIAGNOSTIC UNILATERAL LEFT MAMMOGRAM WITH TOMOSYNTHESIS AND CAD;  ULTRASOUND LEFT BREAST LIMITED  IMPRESSION: 1. There is a highly suspicious 1.1 cm mass in the left breast at 10 o'clock.   2.  No evidence of left axillary lymphadenopathy.   05/23/2021 Cancer Staging   Staging form: Breast, AJCC 8th Edition - Clinical stage from 05/23/2021: Stage IA (cT1c, cN0, cM0, G1, ER+, PR+, HER2-) - Signed by Malachy Mood, MD on 06/07/2021 Stage prefix: Initial diagnosis Histologic grading system: 3 grade system   05/23/2021 Pathology Results   Diagnosis Breast, left, needle core biopsy, 10 o'clock 5 cmfn - INVASIVE MAMMARY CARCINOMA - MAMMARY CARCINOMA IN-SITU - SEE COMMENT Microscopic Comment The biopsy material shows an infiltrative proliferation of cells with arranged linearly and in small clusters. Based on the biopsy, the carcinoma appears Nottingham grade 1-2 of 3 and measures 0.5 cm in greatest linear extent.  E-cadherin is negative supporting lobular origin.  PROGNOSTIC INDICATORS Results: The tumor cells are EQUIVOCAL for Her2 (2+). Her2 by FISH will be performed and results reported separately. Estrogen Receptor: 100%, POSITIVE, STRONG STAINING INTENSITY Progesterone Receptor: 100%, POSITIVE, STRONG STAINING INTENSITY Proliferation Marker Ki67: 5%  FLUORESCENCE IN-SITU HYBRIDIZATION Results: GROUP 5: HER2 **NEGATIVE**   06/07/2021 Initial  Diagnosis   Malignant neoplasm of upper-inner quadrant of left breast in female, estrogen receptor positive  (HCC)   06/27/2021 Cancer Staging   Staging form: Breast, AJCC 8th Edition - Pathologic stage from 06/27/2021: Stage Unknown (pT1b, pNX, cM0, G1, ER+, PR+, HER2-) - Signed by Malachy Mood, MD on 09/10/2021 Stage prefix: Initial diagnosis Multigene prognostic tests performed: None Histologic grading system: 3 grade system   06/27/2021 Definitive Surgery   FINAL MICROSCOPIC DIAGNOSIS:   A. BREAST, LEFT, LUMPECTOMY:  -  Invasive lobular carcinoma, Nottingham grade 1 of 3, 0.9 cm  -  Margins uninvolved by carcinoma (<0.1 cm; posterior margin)  -  Previous biopsy site changes present  -  See oncology table and comment below  Comment(s): Cytokeratin AE1/3 highlights the presence of invasive carcinoma in proximity to the inked posterior margin.    12/14/2021 Survivorship   SCP delivered by Santiago Glad      INTERVAL HISTORY:  Jillian Tyler is here for a follow up of left breast cancer. She was last seen by NP Lacie on 10/03/2022. She presents to the clinic alone. Pt state that she is doing well, but is still having hot flashes at night and its impacting her sleep while taking Exemestane. She state that she is on Trazodone. Overall clinically she is doing well.       All other systems were reviewed with the patient and are negative.  MEDICAL HISTORY:  Past Medical History:  Diagnosis Date   Allergy    Breast cancer [facility])    Personal history of radiation therapy     SURGICAL HISTORY: Past Surgical History:  Procedure Laterality Date   BREAST BIOPSY Left 05/23/2021   BREAST CYST ASPIRATION Right    20 + yrs ago   BREAST LUMPECTOMY Left 06/27/2021   BREAST LUMPECTOMY WITH RADIOACTIVE SEED LOCALIZATION Left 06/27/2021   Procedure: LEFT BREAST LUMPECTOMY WITH RADIOACTIVE SEED LOCALIZATION;  Surgeon: Emelia Loron, MD;  Location: [facility] OR;  Service: General;  Laterality: Left;    I have reviewed the social history and family history with the patient and they are unchanged from  previous note.  ALLERGIES:  is allergic to hydrocod poli-chlorphe poli er, influenza vaccines, latex, and augmentin [amoxicillin-pot clavulanate].  MEDICATIONS:  Current Outpatient Medications  Medication Sig Dispense Refill   acetaminophen (TYLENOL) 500 MG tablet Take 500 mg by mouth every 6 (six) hours as needed (headache).     Cholecalciferol (VITAMIN D3 PO) Take 1 tablet by mouth every evening.     exemestane (AROMASIN) 25 MG tablet TAKE 1 TABLET BY MOUTH DAILY AFTER BREAKFAST. 90 tablet 0   Multiple Vitamin (MULTIVITAMIN WITH MINERALS) TABS tablet Take 1 tablet by mouth every evening. Centrum Multivitamin     Omega-3 Fatty Acids (FISH OIL PO) Take 1 capsule by mouth daily after supper.     traZODone (DESYREL) 100 MG tablet Take 100 mg by mouth at bedtime.     No current facility-administered medications for this visit.    PHYSICAL EXAMINATION: ECOG PERFORMANCE STATUS: 0 - Asymptomatic  There were no vitals filed for this visit. Wt Readings from Last 3 Encounters:  10/03/22 103 lb 12.8 oz (47.1 kg)  03/15/22 105 lb 6.4 oz (47.8 kg)  12/14/21 102 lb 11.2 oz (46.6 kg)     GENERAL:alert, no distress and comfortable SKIN: skin color normal, no rashes or significant lesions EYES: normal, Conjunctiva are pink and non-injected, sclera clear  NEURO: alert & oriented x 3 with fluent speech NECK: (-)supple,  thyroid normal size, non-tender, without nodularity LYMPH: (-)  no palpable lymphadenopathy in the cervical, axillary  BREAST: RT breast  no palpable mass breast exam benign. LT breat lumpectomy minimum scar tissue, coloration is normal. No palpable mass breast exam benign. LABORATORY DATA:  I have reviewed the data as listed    Latest Ref Rng & Units 04/04/2023    1:17 PM 11/28/2022    1:39 PM 10/18/2022    1:21 AM  CBC  WBC 4.0 - 10.5 K/uL 5.7  6.3  11.0   Hemoglobin 12.0 - 15.0 g/dL 16.1  09.6  04.5   Hematocrit 36.0 - 46.0 % 36.2  38.5  36.0   Platelets 150 - 400 K/uL 240   262  235         Latest Ref Rng & Units 11/28/2022    1:39 PM 10/18/2022    1:21 AM 10/03/2022   10:41 AM  CMP  Glucose 70 - 99 mg/dL 409  811  914   BUN 8 - 23 mg/dL 18  18  20    Creatinine 0.44 - 1.00 mg/dL 7.82  9.56  2.13   Sodium 135 - 145 mmol/L 136  137  140   Potassium 3.5 - 5.1 mmol/L 3.9  3.9  4.2   Chloride 98 - 111 mmol/L 99  107  104   CO2 22 - 32 mmol/L 32  25  30   Calcium 8.9 - 10.3 mg/dL 08.6  8.6  57.8   Total Protein 6.5 - 8.1 g/dL 8.1  7.3  8.1   Total Bilirubin 0.3 - 1.2 mg/dL 0.6  0.5  0.8   Alkaline Phos 38 - 126 U/L 35  31  34   AST 15 - 41 U/L 24  39  44   ALT 0 - 44 U/L 25  45  61       RADIOGRAPHIC STUDIES: I have personally reviewed the radiological images as listed and agreed with the findings in the report. No results found.    No orders of the defined types were placed in this encounter.  All questions were answered. The patient knows to call the clinic with any problems, questions or concerns. No barriers to learning was detected. The total time spent in the appointment was 25 minutes.     Malachy Mood, MD 04/04/2023   Carolin Coy, CMA, am acting as scribe for Malachy Mood, MD.   I have reviewed the above documentation for accuracy and completeness, and I agree with the above.

## 2023-05-18 DIAGNOSIS — H259 Unspecified age-related cataract: Secondary | ICD-10-CM | POA: Diagnosis not present

## 2023-05-18 DIAGNOSIS — G4700 Insomnia, unspecified: Secondary | ICD-10-CM | POA: Diagnosis not present

## 2023-05-18 DIAGNOSIS — C50919 Malignant neoplasm of unspecified site of unspecified female breast: Secondary | ICD-10-CM | POA: Diagnosis not present

## 2023-05-18 DIAGNOSIS — Z87891 Personal history of nicotine dependence: Secondary | ICD-10-CM | POA: Diagnosis not present

## 2023-05-18 DIAGNOSIS — J439 Emphysema, unspecified: Secondary | ICD-10-CM | POA: Diagnosis not present

## 2023-05-18 DIAGNOSIS — R32 Unspecified urinary incontinence: Secondary | ICD-10-CM | POA: Diagnosis not present

## 2023-06-06 ENCOUNTER — Other Ambulatory Visit: Payer: Self-pay | Admitting: Hematology

## 2023-06-19 ENCOUNTER — Ambulatory Visit
Admission: RE | Admit: 2023-06-19 | Discharge: 2023-06-19 | Disposition: A | Discharge status: Home / Self Care | Payer: PPO | Source: Ambulatory Visit | Attending: Hematology

## 2023-06-19 DIAGNOSIS — Z170 Estrogen receptor positive status [ER+]: Secondary | ICD-10-CM

## 2023-06-19 DIAGNOSIS — Z853 Personal history of malignant neoplasm of breast: Secondary | ICD-10-CM | POA: Diagnosis not present

## 2023-07-10 DIAGNOSIS — J069 Acute upper respiratory infection, unspecified: Secondary | ICD-10-CM | POA: Diagnosis not present

## 2023-07-10 DIAGNOSIS — E7800 Pure hypercholesterolemia, unspecified: Secondary | ICD-10-CM | POA: Diagnosis not present

## 2023-07-10 DIAGNOSIS — G4700 Insomnia, unspecified: Secondary | ICD-10-CM | POA: Diagnosis not present

## 2023-07-10 DIAGNOSIS — C50212 Malignant neoplasm of upper-inner quadrant of left female breast: Secondary | ICD-10-CM | POA: Diagnosis not present

## 2023-07-10 DIAGNOSIS — E46 Unspecified protein-calorie malnutrition: Secondary | ICD-10-CM | POA: Diagnosis not present

## 2023-07-10 DIAGNOSIS — Z0000 Encounter for general adult medical examination without abnormal findings: Secondary | ICD-10-CM | POA: Diagnosis not present

## 2023-07-10 DIAGNOSIS — Z1331 Encounter for screening for depression: Secondary | ICD-10-CM | POA: Diagnosis not present

## 2023-07-10 DIAGNOSIS — N951 Menopausal and female climacteric states: Secondary | ICD-10-CM | POA: Diagnosis not present

## 2023-07-10 DIAGNOSIS — J439 Emphysema, unspecified: Secondary | ICD-10-CM | POA: Diagnosis not present

## 2023-07-10 DIAGNOSIS — J302 Other seasonal allergic rhinitis: Secondary | ICD-10-CM | POA: Diagnosis not present

## 2023-07-10 DIAGNOSIS — I700 Atherosclerosis of aorta: Secondary | ICD-10-CM | POA: Diagnosis not present

## 2023-08-03 ENCOUNTER — Other Ambulatory Visit: Payer: Self-pay | Admitting: Nurse Practitioner

## 2023-09-05 ENCOUNTER — Telehealth: Payer: Self-pay | Admitting: Nurse Practitioner

## 2023-09-05 NOTE — Telephone Encounter (Signed)
 Rescheduled appointments per provider request. Patient is aware of the made appointments and will be mailed an appointment reminder.

## 2023-09-30 ENCOUNTER — Other Ambulatory Visit: Payer: Self-pay | Admitting: Nurse Practitioner

## 2023-09-30 DIAGNOSIS — Z170 Estrogen receptor positive status [ER+]: Secondary | ICD-10-CM

## 2023-09-30 NOTE — Progress Notes (Unsigned)
 Patient Care Team: Tally Joe, MD as PCP - General (Family Medicine) Emelia Loron, MD as Consulting Physician (General Surgery) Dorothy Puffer, MD as Consulting Physician (Radiation Oncology) Malachy Mood, MD as Consulting Physician (Hematology) Donnelly Angelica, RN as Oncology Nurse Navigator Pershing Proud, RN as Oncology Nurse Navigator Pollyann Samples, NP as Nurse Practitioner (Nurse Practitioner)   CHIEF COMPLAINT: Follow up left breast cancer   Oncology History Overview Note   Cancer Staging  Malignant neoplasm of upper-inner quadrant of left breast in female, estrogen receptor positive [facility]) Staging form: Breast, AJCC 8th Edition - Clinical stage from 05/23/2021: Stage IA (cT1c, cN0, cM0, G1, ER+, PR+, HER2-) - Signed by Malachy Mood, MD on 06/07/2021 - Pathologic stage from 06/27/2021: Stage Unknown (pT1b, pNX, cM0, G1, ER+, PR+, HER2-) - Signed by Malachy Mood, MD on 09/10/2021     Malignant neoplasm of upper-inner quadrant of left breast in female, estrogen receptor positive (HCC)  05/13/2021 Mammogram   EXAM: DIGITAL DIAGNOSTIC UNILATERAL LEFT MAMMOGRAM WITH TOMOSYNTHESIS AND CAD;  ULTRASOUND LEFT BREAST LIMITED  IMPRESSION: 1. There is a highly suspicious 1.1 cm mass in the left breast at 10 o'clock.   2.  No evidence of left axillary lymphadenopathy.   05/23/2021 Cancer Staging   Staging form: Breast, AJCC 8th Edition - Clinical stage from 05/23/2021: Stage IA (cT1c, cN0, cM0, G1, ER+, PR+, HER2-) - Signed by Malachy Mood, MD on 06/07/2021 Stage prefix: Initial diagnosis Histologic grading system: 3 grade system   05/23/2021 Pathology Results   Diagnosis Breast, left, needle core biopsy, 10 o'clock 5 cmfn - INVASIVE MAMMARY CARCINOMA - MAMMARY CARCINOMA IN-SITU - SEE COMMENT Microscopic Comment The biopsy material shows an infiltrative proliferation of cells with arranged linearly and in small clusters. Based on the biopsy, the carcinoma appears Nottingham  grade 1-2 of 3 and measures 0.5 cm in greatest linear extent.  E-cadherin is negative supporting lobular origin.  PROGNOSTIC INDICATORS Results: The tumor cells are EQUIVOCAL for Her2 (2+). Her2 by FISH will be performed and results reported separately. Estrogen Receptor: 100%, POSITIVE, STRONG STAINING INTENSITY Progesterone Receptor: 100%, POSITIVE, STRONG STAINING INTENSITY Proliferation Marker Ki67: 5%  FLUORESCENCE IN-SITU HYBRIDIZATION Results: GROUP 5: HER2 **NEGATIVE**   06/07/2021 Initial Diagnosis   Malignant neoplasm of upper-inner quadrant of left breast in female, estrogen receptor positive (HCC)   06/27/2021 Cancer Staging   Staging form: Breast, AJCC 8th Edition - Pathologic stage from 06/27/2021: Stage Unknown (pT1b, pNX, cM0, G1, ER+, PR+, HER2-) - Signed by Malachy Mood, MD on 09/10/2021 Stage prefix: Initial diagnosis Multigene prognostic tests performed: None Histologic grading system: 3 grade system   06/27/2021 Definitive Surgery   FINAL MICROSCOPIC DIAGNOSIS:   A. BREAST, LEFT, LUMPECTOMY:  -  Invasive lobular carcinoma, Nottingham grade 1 of 3, 0.9 cm  -  Margins uninvolved by carcinoma (<0.1 cm; posterior margin)  -  Previous biopsy site changes present  -  See oncology table and comment below  Comment(s): Cytokeratin AE1/3 highlights the presence of invasive carcinoma in proximity to the inked posterior margin.    12/14/2021 Survivorship   SCP delivered by Santiago Glad      CURRENT THERAPY: Exemestane, starting 09/2021  INTERVAL HISTORY Ms. Jillian Tyler returns for follow up as scheduled. Last seen by Dr. Mosetta Putt 04/2023.  Doing well overall with no significant changes in her health.  Some joint pain/stiffness in the morning resolves throughout the day.  Denies new or worsening pain.  Denies breast concerns such as new lump/mass,  nipple discharge or inversion, or skin change.  Takes gabapentin at night which helps her hot flashes but she stopped taking trazodone so  she has not been sleeping well and has daytime fatigue.  This is her biggest complaint today.  ROS  All other systems reviewed and negative  Past Medical History:  Diagnosis Date   Allergy    Breast cancer [facility])    Personal history of radiation therapy      Past Surgical History:  Procedure Laterality Date   BREAST BIOPSY Left 05/23/2021   BREAST CYST ASPIRATION Right    20 + yrs ago   BREAST LUMPECTOMY Left 06/27/2021   BREAST LUMPECTOMY WITH RADIOACTIVE SEED LOCALIZATION Left 06/27/2021   Procedure: LEFT BREAST LUMPECTOMY WITH RADIOACTIVE SEED LOCALIZATION;  Surgeon: Emelia Loron, MD;  Location: [facility] OR;  Service: General;  Laterality: Left;     Outpatient Encounter Medications as of 10/01/2023  Medication Sig   acetaminophen (TYLENOL) 500 MG tablet Take 500 mg by mouth every 6 (six) hours as needed (headache).   Cholecalciferol (VITAMIN D3 PO) Take 1 tablet by mouth every evening.   gabapentin (NEURONTIN) 100 MG capsule TAKE 1 CAPSULE (100 MG TOTAL) BY MOUTH AT BEDTIME. TAKE FOR NIGHT SWEATS, IF TOLERATED WELL BUT NOT ADEQUATE FOR HOT FLUSHES, OK TO INCREASE TO 2 CAPS AND 3 CAPS IN SECOND AND THIRD WEEK IF NEEDED   Multiple Vitamin (MULTIVITAMIN WITH MINERALS) TABS tablet Take 1 tablet by mouth every evening. Centrum Multivitamin   Omega-3 Fatty Acids (FISH OIL PO) Take 1 capsule by mouth daily after supper.   [DISCONTINUED] exemestane (AROMASIN) 25 MG tablet TAKE 1 TABLET BY MOUTH DAILY AFTER BREAKFAST.   [DISCONTINUED] traZODone (DESYREL) 100 MG tablet Take 100 mg by mouth at bedtime.   exemestane (AROMASIN) 25 MG tablet TAKE 1 TABLET BY MOUTH DAILY AFTER BREAKFAST.   traZODone (DESYREL) 100 MG tablet Take 1 tablet (100 mg total) by mouth at bedtime.   No facility-administered encounter medications on file as of 10/01/2023.     Today's Vitals   10/01/23 1008 10/01/23 1021  BP: (!) 136/55   Pulse: (!) 54   Resp: 16   Temp: 97.8 F (36.6 C)   TempSrc: Temporal   SpO2:  100%   Weight: 107 lb 11.2 oz (48.9 kg)   PainSc:  0-No pain   Body mass index is 18.49 kg/m.   PHYSICAL EXAM GENERAL:alert, no distress and comfortable SKIN: no rash  EYES: sclera clear NECK: without mass LYMPH:  no palpable cervical or supraclavicular lymphadenopathy  LUNGS: normal breathing effort HEART: no lower extremity edema ABDOMEN: abdomen soft, non-tender  NEURO: alert & oriented x 3 with fluent speech, no focal motor/sensory deficits Breast exam: No nipple discharge or inversion.  S/p left lumpectomy, incision completely healed with mild scar tissue.  No palpable mass or nodularity in either breast or axilla that I could appreciate.   CBC    Component Value Date/Time   WBC 7.9 10/01/2023 0950   WBC 11.0 (H) 10/18/2022 0121   RBC 4.04 10/01/2023 0950   HGB 12.4 10/01/2023 0950   HCT 37.3 10/01/2023 0950   PLT 259 10/01/2023 0950   MCV 92.3 10/01/2023 0950   MCH 30.7 10/01/2023 0950   MCHC 33.2 10/01/2023 0950   RDW 12.7 10/01/2023 0950   LYMPHSABS 1.3 10/01/2023 0950   MONOABS 0.7 10/01/2023 0950   EOSABS 0.2 10/01/2023 0950   BASOSABS 0.0 10/01/2023 0950     CMP  Component Value Date/Time   NA 138 10/01/2023 0950   K 4.3 10/01/2023 0950   CL 104 10/01/2023 0950   CO2 29 10/01/2023 0950   GLUCOSE 95 10/01/2023 0950   BUN 18 10/01/2023 0950   CREATININE 0.74 10/01/2023 0950   CALCIUM 9.8 10/01/2023 0950   PROT 7.8 10/01/2023 0950   ALBUMIN 4.1 10/01/2023 0950   AST 19 10/01/2023 0950   ALT 13 10/01/2023 0950   ALKPHOS 35 (L) 10/01/2023 0950   BILITOT 0.7 10/01/2023 0950   GFRNONAA >60 10/01/2023 0950     ASSESSMENT & PLAN:Cristina L Scarbro is a 78 yo old post menopausal female with     1. Malignant neoplasm of upper-inner quadrant of left breast, invasive lobular carcinoma, stage IA, p(T1b, N0), ER+/PR+/HER2-, Grade 1-2 -found on screening mammogram. S/p left lumpectomy on 06/27/21 with Dr. Dwain Sarna showed 0.9 cm ILC, grade 1. Margins close but  uninvolved. -s/p radiation under Dr. Mitzi Hansen 08/17/21 - 09/14/21. -she started exemestane in 09/2021, plan for 7 years due to lobular histology -Mammogram 06/14/2022 showed breast density category B, no concern for malignancy -Ms. Alper is clinically doing well, tolerating exemestane.  Breast exam is benign, labs are unremarkable.  Overall no clinical concern for recurrence -Continue breast cancer surveillance and AI, refilled -Follow-up in 6 months, or sooner if needed   2. Hot flashes, Headaches -secondary to exemestane -Hot flashes have improved and headaches resolved -On gabapentin 100 mg nightly -She has not been sleeping well and would like to restart trazodone, which she was on before breast cancer diagnosis I refilled for 1 month, defer to PCP for future refills   3. Bone Health  -Her most recent DEXA was normal, per pt. -DEXA 06/14/2022 showed osteopenia at the LFN, T-score -1.2 with low FRAX score -Recommend to optimize calcium, vitamin D, and weightbearing exercise    PLAN: -Recent mammogram and today's labs reviewed -Continue breast cancer surveillance and exemestane, refilled -Refilled trazodone x30 days, pt will ask PCP to refill in the future -Follow-up in 6 months, or sooner if needed -Mammogram and DEXA due 05/2024   All questions were answered. The patient knows to call the clinic with any problems, questions or concerns. No barriers to learning were detected.   Santiago Glad, NP-C 10/01/2023

## 2023-10-01 ENCOUNTER — Inpatient Hospital Stay: Payer: PPO | Admitting: Nurse Practitioner

## 2023-10-01 ENCOUNTER — Encounter: Payer: Self-pay | Admitting: Nurse Practitioner

## 2023-10-01 ENCOUNTER — Inpatient Hospital Stay: Payer: PPO | Attending: Nurse Practitioner

## 2023-10-01 VITALS — BP 136/55 | HR 54 | Temp 97.8°F | Resp 16 | Wt 107.7 lb

## 2023-10-01 DIAGNOSIS — Z1721 Progesterone receptor positive status: Secondary | ICD-10-CM | POA: Insufficient documentation

## 2023-10-01 DIAGNOSIS — Z1732 Human epidermal growth factor receptor 2 negative status: Secondary | ICD-10-CM | POA: Insufficient documentation

## 2023-10-01 DIAGNOSIS — Z170 Estrogen receptor positive status [ER+]: Secondary | ICD-10-CM | POA: Insufficient documentation

## 2023-10-01 DIAGNOSIS — Z923 Personal history of irradiation: Secondary | ICD-10-CM | POA: Diagnosis not present

## 2023-10-01 DIAGNOSIS — M8580 Other specified disorders of bone density and structure, unspecified site: Secondary | ICD-10-CM | POA: Insufficient documentation

## 2023-10-01 DIAGNOSIS — C50212 Malignant neoplasm of upper-inner quadrant of left female breast: Secondary | ICD-10-CM | POA: Insufficient documentation

## 2023-10-01 DIAGNOSIS — Z79811 Long term (current) use of aromatase inhibitors: Secondary | ICD-10-CM | POA: Diagnosis not present

## 2023-10-01 DIAGNOSIS — Z79899 Other long term (current) drug therapy: Secondary | ICD-10-CM | POA: Insufficient documentation

## 2023-10-01 DIAGNOSIS — N951 Menopausal and female climacteric states: Secondary | ICD-10-CM | POA: Diagnosis not present

## 2023-10-01 LAB — CBC WITH DIFFERENTIAL (CANCER CENTER ONLY)
Abs Immature Granulocytes: 0.02 10*3/uL (ref 0.00–0.07)
Basophils Absolute: 0 10*3/uL (ref 0.0–0.1)
Basophils Relative: 1 %
Eosinophils Absolute: 0.2 10*3/uL (ref 0.0–0.5)
Eosinophils Relative: 2 %
HCT: 37.3 % (ref 36.0–46.0)
Hemoglobin: 12.4 g/dL (ref 12.0–15.0)
Immature Granulocytes: 0 %
Lymphocytes Relative: 17 %
Lymphs Abs: 1.3 10*3/uL (ref 0.7–4.0)
MCH: 30.7 pg (ref 26.0–34.0)
MCHC: 33.2 g/dL (ref 30.0–36.0)
MCV: 92.3 fL (ref 80.0–100.0)
Monocytes Absolute: 0.7 10*3/uL (ref 0.1–1.0)
Monocytes Relative: 9 %
Neutro Abs: 5.6 10*3/uL (ref 1.7–7.7)
Neutrophils Relative %: 71 %
Platelet Count: 259 10*3/uL (ref 150–400)
RBC: 4.04 MIL/uL (ref 3.87–5.11)
RDW: 12.7 % (ref 11.5–15.5)
WBC Count: 7.9 10*3/uL (ref 4.0–10.5)
nRBC: 0 % (ref 0.0–0.2)

## 2023-10-01 LAB — CMP (CANCER CENTER ONLY)
ALT: 13 U/L (ref 0–44)
AST: 19 U/L (ref 15–41)
Albumin: 4.1 g/dL (ref 3.5–5.0)
Alkaline Phosphatase: 35 U/L — ABNORMAL LOW (ref 38–126)
Anion gap: 5 (ref 5–15)
BUN: 18 mg/dL (ref 8–23)
CO2: 29 mmol/L (ref 22–32)
Calcium: 9.8 mg/dL (ref 8.9–10.3)
Chloride: 104 mmol/L (ref 98–111)
Creatinine: 0.74 mg/dL (ref 0.44–1.00)
GFR, Estimated: >60 mL/min (ref >60)
Glucose, Bld: 95 mg/dL (ref 70–99)
Potassium: 4.3 mmol/L (ref 3.5–5.1)
Sodium: 138 mmol/L (ref 135–145)
Total Bilirubin: 0.7 mg/dL (ref 0.0–1.2)
Total Protein: 7.8 g/dL (ref 6.5–8.1)

## 2023-10-01 MED ORDER — EXEMESTANE 25 MG PO TABS: ORAL_TABLET | ORAL | 1 refills | Status: DC

## 2023-10-01 MED ORDER — TRAZODONE HCL 100 MG PO TABS: 100.0000 mg | ORAL_TABLET | Freq: Every day | ORAL | 0 refills | Status: AC

## 2023-10-02 ENCOUNTER — Telehealth: Payer: Self-pay | Admitting: Nurse Practitioner

## 2023-10-02 NOTE — Telephone Encounter (Signed)
 Patient is aware of future scheduled appointments per 10/01/2023 scheduling orders

## 2023-10-03 ENCOUNTER — Other Ambulatory Visit: Payer: PPO

## 2023-10-03 ENCOUNTER — Ambulatory Visit: Payer: PPO | Admitting: Nurse Practitioner

## 2023-10-05 ENCOUNTER — Other Ambulatory Visit: Payer: Self-pay | Admitting: Nurse Practitioner

## 2023-10-09 DIAGNOSIS — H2511 Age-related nuclear cataract, right eye: Secondary | ICD-10-CM | POA: Diagnosis not present

## 2023-10-09 DIAGNOSIS — H25812 Combined forms of age-related cataract, left eye: Secondary | ICD-10-CM | POA: Diagnosis not present

## 2023-10-09 DIAGNOSIS — H04123 Dry eye syndrome of bilateral lacrimal glands: Secondary | ICD-10-CM | POA: Diagnosis not present

## 2023-12-05 ENCOUNTER — Other Ambulatory Visit: Payer: Self-pay | Admitting: Hematology

## 2024-01-10 DIAGNOSIS — H5051 Esophoria: Secondary | ICD-10-CM | POA: Diagnosis not present

## 2024-01-10 DIAGNOSIS — H43812 Vitreous degeneration, left eye: Secondary | ICD-10-CM | POA: Diagnosis not present

## 2024-01-10 DIAGNOSIS — H04123 Dry eye syndrome of bilateral lacrimal glands: Secondary | ICD-10-CM | POA: Diagnosis not present

## 2024-01-10 DIAGNOSIS — H25813 Combined forms of age-related cataract, bilateral: Secondary | ICD-10-CM | POA: Diagnosis not present

## 2024-01-29 ENCOUNTER — Other Ambulatory Visit: Payer: Self-pay | Admitting: Nurse Practitioner

## 2024-03-03 DIAGNOSIS — H25812 Combined forms of age-related cataract, left eye: Secondary | ICD-10-CM | POA: Diagnosis not present

## 2024-03-11 DIAGNOSIS — H25812 Combined forms of age-related cataract, left eye: Secondary | ICD-10-CM | POA: Diagnosis not present

## 2024-03-11 DIAGNOSIS — H268 Other specified cataract: Secondary | ICD-10-CM | POA: Diagnosis not present

## 2024-03-28 ENCOUNTER — Encounter (HOSPITAL_COMMUNITY): Payer: Self-pay

## 2024-03-28 ENCOUNTER — Ambulatory Visit (HOSPITAL_COMMUNITY)
Admission: EM | Admit: 2024-03-28 | Discharge: 2024-03-28 | Disposition: A | Discharge status: Home / Self Care | Attending: Physician Assistant | Admitting: Physician Assistant

## 2024-03-28 DIAGNOSIS — L255 Unspecified contact dermatitis due to plants, except food: Secondary | ICD-10-CM

## 2024-03-28 MED ORDER — TRIAMCINOLONE ACETONIDE 0.1 % EX CREA: 1.0000 | TOPICAL_CREAM | Freq: Two times a day (BID) | CUTANEOUS | 0 refills | Status: AC

## 2024-03-28 MED ORDER — PREDNISONE 10 MG (21) PO TBPK: ORAL_TABLET | ORAL | 0 refills | Status: AC

## 2024-03-28 NOTE — ED Triage Notes (Signed)
 Patient here today with c/o itchy rash on both arms X 1 week after working in the yard. Patient has been using a cream with no relief. The rash has been spreading.

## 2024-03-28 NOTE — ED Provider Notes (Signed)
 [facility]    CSN: [id number] Arrival date & time: 03/28/24  1924      History   Chief Complaint Chief Complaint  Patient presents with   Rash    HPI Jillian Tyler is a 78 y.o. female.   Patient presents today with a 5-day history of intensely pruritic rash on bilateral forearms.  She reports that symptoms began after she was working in her yard but denies any specific exposures including to plants, insects, animals.  She has been using topical anti-itch cream without improvement.  Reports that the rash has been spreading and now involves both of her hands and spreading up her arms prompting evaluation.  She denies any shortness of breath, swelling of her throat, muffled voice.  Denies any nausea, vomiting, chest pain, shortness of breath.  She denies history of diabetes.  She is having difficulty with daily activities as a result of the itching as the pruritus is keeping her up at night and she has been more fatigued.    Past Medical History:  Diagnosis Date   Allergy    Breast cancer [facility])    Personal history of radiation therapy     Patient Active Problem List   Diagnosis Date Noted   Osteopenia 04/04/2023   Malignant neoplasm of upper-inner quadrant of left breast in female, estrogen receptor positive (HCC) 06/07/2021    Past Surgical History:  Procedure Laterality Date   BREAST BIOPSY Left 05/23/2021   BREAST CYST ASPIRATION Right    20 + yrs ago   BREAST LUMPECTOMY Left 06/27/2021   BREAST LUMPECTOMY WITH RADIOACTIVE SEED LOCALIZATION Left 06/27/2021   Procedure: LEFT BREAST LUMPECTOMY WITH RADIOACTIVE SEED LOCALIZATION;  Surgeon: Ebbie Cough, MD;  Location: [facility] OR;  Service: General;  Laterality: Left;    OB History   No obstetric history on file.      Home Medications    Prior to Admission medications   Medication Sig Start Date End Date Taking? Authorizing Provider  predniSONE  (STERAPRED UNI-PAK 21 TAB) 10 MG (21) TBPK tablet Take 4  tablets (40 mg) days 1 and 2, take 3 tablets (30 mg) days 3 and 4, take 2 tablets (20 mg) days 5 and 6, take 1 tablet (10 mg) days 7 and 8, take 0.5 tablet (5 mg) days 9 and 10.  Then stop. 03/28/24  Yes Gavriela Cashin K, PA-C  triamcinolone  cream (KENALOG ) 0.1 % Apply 1 Application topically 2 (two) times daily. 03/28/24  Yes Rossie Scarfone K, PA-C  acetaminophen  (TYLENOL ) 500 MG tablet Take 500 mg by mouth every 6 (six) hours as needed (headache).    [provider]  Cholecalciferol (VITAMIN D3 PO) Take 1 tablet by mouth every evening.    [provider]  exemestane  (AROMASIN ) 25 MG tablet TAKE 1 TABLET BY MOUTH DAILY AFTER BREAKFAST. 10/01/23   Burton, Lacie K, NP  gabapentin  (NEURONTIN ) 100 MG capsule TAKE 1 CAPSULE BY MOUTH AT BEDTIME. TAKE FOR NIGHT SWEATS, IF TOLERATED WELL BUT NOT ADEQUATE FOR HOT FLUSHES, OK TO INCREASE TO 2 CAPS AND 3 CAPS IN SECOND AND THIRD WEEK IF NEEDED 01/31/24   Burton, Lacie K, NP  Multiple Vitamin (MULTIVITAMIN WITH MINERALS) TABS tablet Take 1 tablet by mouth every evening. Centrum Multivitamin    [provider]  Omega-3 Fatty Acids (FISH OIL PO) Take 1 capsule by mouth daily after supper.    [provider]  traZODone  (DESYREL ) 100 MG tablet Take 1 tablet (100 mg total) by mouth  at bedtime. 10/01/23   Burton, Lacie K, NP    Family History Family History  Problem Relation Age of Onset   Stroke Mother    Prostate cancer Father    Kidney cancer Brother    Lung disease Neg Hx     Social History Social History   Tobacco Use   Smoking status: Former    Current packs/day: 0.25    Average packs/day: 0.3 packs/day for 53.7 years (13.4 ttl pk-yrs)    Types: Cigarettes    Start date: 1972   Smokeless tobacco: Never   Tobacco comments:    2 cigs daily-ep 05/31/21  Vaping Use   Vaping status: Never Used  Substance Use Topics   Alcohol use: Never   Drug use: Never     Allergies   Hydrocod poli-chlorphe poli er, Influenza  vaccines, Latex, and Augmentin [amoxicillin-pot clavulanate]   Review of Systems Review of Systems  Constitutional:  Positive for activity change and fatigue. Negative for appetite change and fever.  Respiratory:  Negative for shortness of breath.   Cardiovascular:  Negative for chest pain.  Gastrointestinal:  Negative for abdominal pain, diarrhea, nausea and vomiting.  Skin:  Positive for rash.  Psychiatric/Behavioral:  Positive for sleep disturbance.      Physical Exam Triage Vital Signs ED Triage Vitals  Encounter Vitals Group     BP 03/28/24 2023 (!) 160/74     Girls Systolic BP Percentile --      Girls Diastolic BP Percentile --      Boys Systolic BP Percentile --      Boys Diastolic BP Percentile --      Pulse Rate 03/28/24 2023 78     Resp 03/28/24 2023 16     Temp 03/28/24 2023 97.9 F (36.6 C)     Temp Source 03/28/24 2023 Oral     SpO2 03/28/24 2023 92 %     Weight --      Height --      Head Circumference --      Peak Flow --      Pain Score 03/28/24 2026 0     Pain Loc --      Pain Education --      Exclude from Growth Chart --    No data found.  Updated Vital Signs BP (!) 160/74 (BP Location: Left Arm)   Pulse 78   Temp 97.9 F (36.6 C) (Oral)   Resp 16   SpO2 92%   Visual Acuity Right Eye Distance:   Left Eye Distance:   Bilateral Distance:    Right Eye Near:   Left Eye Near:    Bilateral Near:     Physical Exam Vitals reviewed.  Constitutional:      General: She is awake. She is not in acute distress.    Appearance: Normal appearance. She is well-developed. She is not ill-appearing.     Comments: Very pleasant female appears stated age in no acute distress sitting comfortably in exam room.  HENT:     Head: Normocephalic and atraumatic.     Mouth/Throat:     Pharynx: Uvula midline. No oropharyngeal exudate or posterior oropharyngeal erythema.  Cardiovascular:     Rate and Rhythm: Normal rate and regular rhythm.     Heart sounds:  Normal heart sounds, S1 normal and S2 normal. No murmur heard. Pulmonary:     Effort: Pulmonary effort is normal.     Breath sounds: Normal breath sounds. No wheezing, rhonchi or  rales.     Comments: Clear to auscultation bilaterally Skin:    Findings: Rash present. Rash is macular, papular and vesicular.     Comments: Maculopapular rash with significant excoriation and scattered vesicles noted bilateral forearms with scattered lesions on hands.  Psychiatric:        Behavior: Behavior is cooperative.      UC Treatments / Results  Labs (all labs ordered are listed, but only abnormal results are displayed) Labs Reviewed - No data to display  EKG   Radiology No results found.  Procedures Procedures (including critical care time)  Medications Ordered in UC Medications - No data to display  Initial Impression / Assessment and Plan / UC Course  I have reviewed the triage vital signs and the nursing notes.  Pertinent labs & imaging results that were available during my care of the patient were reviewed by me and considered in my medical decision making (see chart for details).     Patient is well-appearing, afebrile, nontoxic, nontachycardic.  Symptoms are consistent with rhus dermatitis.  Patient has tried topical and conservative treatment measures without improvement of symptoms.  Will try systemic steroids given her persistent symptoms and we discussed that she should begin these tomorrow (03/29/2024) as it would likely cause insomnia and other potential side effects.  She can use an histamines over-the-counter to help manage the itching overnight as well as topical corticosteroids (triamcinolone  that was sent to pharmacy).  We discussed the importance of keeping area clean to prevent secondary infection.  If her symptoms are not improving or if anything worsens she is to return for reevaluation.  Strict return precautions given.  All questions answered to patient  satisfaction.  Final Clinical Impressions(s) / UC Diagnoses   Final diagnoses:  Rhus dermatitis     Discharge Instructions      We are treating you for poison ivy.  Apply triamcinolone  up to twice a day to help with your symptoms.  Keep the area clean and avoid scratching as this can lead to an infection.  If there are any signs of infection including redness, bleeding, drainage, swelling, pain, fever, nausea, vomiting you need to be seen immediately.  Start prednisone  tomorrow (03/28/2024).  Do not take NSAIDs with this medication including aspirin, ibuprofen/Advil, naproxen/Aleve.  If your symptoms or not improving or if anything worsens please return for reevaluation.     ED Prescriptions     Medication Sig Dispense Auth. Provider   predniSONE  (STERAPRED UNI-PAK 21 TAB) 10 MG (21) TBPK tablet Take 4 tablets (40 mg) days 1 and 2, take 3 tablets (30 mg) days 3 and 4, take 2 tablets (20 mg) days 5 and 6, take 1 tablet (10 mg) days 7 and 8, take 0.5 tablet (5 mg) days 9 and 10.  Then stop. 21 tablet Nnenna Meador K, PA-C   triamcinolone  cream (KENALOG ) 0.1 % Apply 1 Application topically 2 (two) times daily. 30 g Jaimere Feutz K, PA-C      PDMP not reviewed this encounter.   Sherrell Rocky POUR, PA-C 03/28/24 2057

## 2024-03-28 NOTE — Discharge Instructions (Signed)
 We are treating you for poison ivy.  Apply triamcinolone  up to twice a day to help with your symptoms.  Keep the area clean and avoid scratching as this can lead to an infection.  If there are any signs of infection including redness, bleeding, drainage, swelling, pain, fever, nausea, vomiting you need to be seen immediately.  Start prednisone  tomorrow (03/28/2024).  Do not take NSAIDs with this medication including aspirin, ibuprofen/Advil, naproxen/Aleve.  If your symptoms or not improving or if anything worsens please return for reevaluation.

## 2024-04-01 ENCOUNTER — Other Ambulatory Visit: Payer: Self-pay

## 2024-04-01 DIAGNOSIS — C50212 Malignant neoplasm of upper-inner quadrant of left female breast: Secondary | ICD-10-CM

## 2024-04-01 NOTE — Assessment & Plan Note (Addendum)
 invasive lobular carcinoma, stage IA, p(T1b, N0), ER+/PR+/HER2-, Grade 1-2 -found on screening mammogram. S/p left lumpectomy on 06/27/21 with Dr. Ebbie showed 0.9 cm ILC, grade 1. Margins close but uninvolved. -s/p radiation under Dr. Dewey 08/17/21 - 09/14/21. -she started exemestane  in 09/2021, plan for 7 years due to lobular histology -Mammogram 06/19/2023 -  showed breast density category B, no concern for malignancy -Continue breast cancer surveillance.  -Continue exemestane  25 mg daily. -Follow-up in 6 months, or sooner if needed --Diagnostic mammogram and dexa scan due 05/2024.

## 2024-04-01 NOTE — Progress Notes (Signed)
 Patient Care Team: Seabron Lenis, MD as PCP - General (Family Medicine) Ebbie Cough, MD as Consulting Physician (General Surgery) Dewey Rush, MD as Consulting Physician (Radiation Oncology) Lanny Callander, MD as Consulting Physician (Hematology) Tyree Nanetta SAILOR, RN as Oncology Nurse Navigator Glean, Stephane BROCKS, RN (Inactive) as Oncology Nurse Navigator Burton, Lacie K, NP as Nurse Practitioner (Nurse Practitioner)  Clinic Day:  04/02/2024  Referring physician: Seabron Lenis, MD  ASSESSMENT & PLAN:   Assessment & Plan: Malignant neoplasm of upper-inner quadrant of left breast in female, estrogen receptor positive (HCC)  invasive lobular carcinoma, stage IA, p(T1b, N0), ER+/PR+/HER2-, Grade 1-2 -found on screening mammogram. S/p left lumpectomy on 06/27/21 with Dr. Ebbie showed 0.9 cm ILC, grade 1. Margins close but uninvolved. -s/p radiation under Dr. Dewey 08/17/21 - 09/14/21. -she started exemestane  in 09/2021, plan for 7 years due to lobular histology -Mammogram 06/19/2023 -  showed breast density category B, no concern for malignancy -Continue breast cancer surveillance.  -Continue exemestane  25 mg daily. -Follow-up in 6 months, or sooner if needed --Diagnostic mammogram and dexa scan due 05/2024.    Poison Oak Seen in Urgent Care and diagnosed with poison oak. Currently on prednisone  taper.  WBC slightly elevated at 14.6 and ANC 11.2.  Likely due to prednisone  new infection with poison oak.  Last day prednisone  taper is tomorrow.  Gradually improving.   Osteopenia Osteopenia present on recent bone density done Im 05/2022. She stays active. Takes calcium and vitamin d. Has been taking exemestane  since 2023.  DEXA scan ordered as part of today's visit.  Due in November 2025.  Plan Labs reviewed.  -mild elevation of WBC and ANC. Likely from poison oak and prednisone  treatment.  -CMP is unremarkable.  3D diagnostic mammogram due in November.  Ordered as part of today's  visit. DEXA scan also due in November.  Ordered as part of today's visit. Continue exemestane  25 mg daily. Labs and follow-up in 6 months, sooner if needed.   I provided 25 minutes of face-to-face time during this encounter and > 50% was spent counseling as documented under my assessment and plan.    Powell FORBES Lessen, NP  [facility] - A DEPT [facility] [address] [place] [address] Dept: [phone] Dept Fax: [phone]   Orders Placed This Encounter  Procedures   MM 3D DIAGNOSTIC MAMMOGRAM BILATERAL BREAST    Standing Status:   Future    Expected Date:   06/18/2024    Expiration Date:   04/06/2025    Scheduling Instructions:     Please schedule along with bone density if possible.    Reason for Exam (SYMPTOM  OR DIAGNOSIS REQUIRED):   breast cancer follow up    Preferred imaging location?:   [facility]   DG Bone Density    Standing Status:   Future    Expected Date:   06/18/2024    Expiration Date:   04/06/2025    Scheduling Instructions:     Please schedule along with diagnostic mammogram.    Reason for Exam (SYMPTOM  OR DIAGNOSIS REQUIRED):   sestrogen deficiency    Preferred imaging location?:   [facility]    Release to patient:   Immediate      CHIEF COMPLAINT:  CC: Left breast cancer, ER positive  Current Treatment: Exemestane  25 mg daily  INTERVAL HISTORY:  Jillian Tyler is here today for repeat clinical assessment. She last saw  Lacie, NP, on 10/01/2023.  Last 3D diagnostic mammogram done 06/18/2023.  She has category B breast density.  Overall results were benign.  Recall exam should be scheduled for November 2025.  Most recent DEXA scan done 06/14/2022.  Results showed osteopenia without osteoporosis.  The patient states that she had a rough few weeks. She had left cataract removal on 03/13/2024.  She was stung by a bee on her right earlobe 2 weeks after that.  Now, dealing with poison  oak.  She is on final days of prednisone  taper.  It is gradually improving. She denies problems or changes in either breast.  She denies chest pain, chest pressure, or shortness of breath. She denies headaches or visual disturbances. He denies abdominal pain, nausea, vomiting, or changes in bowel or bladder habits. She denies fevers or chills. She denies pain. Her appetite is good. Her weight has been stable.  I have reviewed the past medical history, past surgical history, social history and family history with the patient and they are unchanged from previous note.  ALLERGIES:  is allergic to hydrocod poli-chlorphe poli er, influenza vaccines, latex, and augmentin [amoxicillin-pot clavulanate].  MEDICATIONS:  Current Outpatient Medications  Medication Sig Dispense Refill   acetaminophen  (TYLENOL ) 500 MG tablet Take 500 mg by mouth every 6 (six) hours as needed (headache).     Cholecalciferol (VITAMIN D3 PO) Take 1 tablet by mouth every evening.     exemestane  (AROMASIN ) 25 MG tablet TAKE 1 TABLET BY MOUTH DAILY AFTER BREAKFAST. 90 tablet 1   gabapentin  (NEURONTIN ) 100 MG capsule TAKE 1 CAPSULE BY MOUTH AT BEDTIME. TAKE FOR NIGHT SWEATS, IF TOLERATED WELL BUT NOT ADEQUATE FOR HOT FLUSHES, OK TO INCREASE TO 2 CAPS AND 3 CAPS IN SECOND AND THIRD WEEK IF NEEDED 30 capsule 1   Multiple Vitamin (MULTIVITAMIN WITH MINERALS) TABS tablet Take 1 tablet by mouth every evening. Centrum Multivitamin     Omega-3 Fatty Acids (FISH OIL PO) Take 1 capsule by mouth daily after supper.     predniSONE  (STERAPRED UNI-PAK 21 TAB) 10 MG (21) TBPK tablet Take 4 tablets (40 mg) days 1 and 2, take 3 tablets (30 mg) days 3 and 4, take 2 tablets (20 mg) days 5 and 6, take 1 tablet (10 mg) days 7 and 8, take 0.5 tablet (5 mg) days 9 and 10.  Then stop. 21 tablet 0   traZODone  (DESYREL ) 100 MG tablet Take 1 tablet (100 mg total) by mouth at bedtime. 30 tablet 0   triamcinolone  cream (KENALOG ) 0.1 % Apply 1 Application topically  2 (two) times daily. 30 g 0   No current facility-administered medications for this visit.    HISTORY OF PRESENT ILLNESS:   Oncology History Overview Note   Cancer Staging  Malignant neoplasm of upper-inner quadrant of left breast in female, estrogen receptor positive (HCC) Staging form: Breast, AJCC 8th Edition - Clinical stage from 05/23/2021: Stage IA (cT1c, cN0, cM0, G1, ER+, PR+, HER2-) - Signed by Lanny Callander, MD on 06/07/2021 - Pathologic stage from 06/27/2021: Stage Unknown (pT1b, pNX, cM0, G1, ER+, PR+, HER2-) - Signed by Lanny Callander, MD on 09/10/2021     Malignant neoplasm of upper-inner quadrant of left breast in female, estrogen receptor positive (HCC)  05/13/2021 Mammogram   EXAM: DIGITAL DIAGNOSTIC UNILATERAL LEFT MAMMOGRAM WITH TOMOSYNTHESIS AND CAD;  ULTRASOUND LEFT BREAST LIMITED  IMPRESSION: 1. There is a highly suspicious 1.1 cm mass in the left breast at 10 o'clock.   2.  No  evidence of left axillary lymphadenopathy.   05/23/2021 Cancer Staging   Staging form: Breast, AJCC 8th Edition - Clinical stage from 05/23/2021: Stage IA (cT1c, cN0, cM0, G1, ER+, PR+, HER2-) - Signed by Lanny Callander, MD on 06/07/2021 Stage prefix: Initial diagnosis Histologic grading system: 3 grade system   05/23/2021 Pathology Results   Diagnosis Breast, left, needle core biopsy, 10 o'clock 5 cmfn - INVASIVE MAMMARY CARCINOMA - MAMMARY CARCINOMA IN-SITU - SEE COMMENT Microscopic Comment The biopsy material shows an infiltrative proliferation of cells with arranged linearly and in small clusters. Based on the biopsy, the carcinoma appears Nottingham grade 1-2 of 3 and measures 0.5 cm in greatest linear extent.  E-cadherin is negative supporting lobular origin.  PROGNOSTIC INDICATORS Results: The tumor cells are EQUIVOCAL for Her2 (2+). Her2 by FISH will be performed and results reported separately. Estrogen Receptor: 100%, POSITIVE, STRONG STAINING INTENSITY Progesterone Receptor:  100%, POSITIVE, STRONG STAINING INTENSITY Proliferation Marker Ki67: 5%  FLUORESCENCE IN-SITU HYBRIDIZATION Results: GROUP 5: HER2 **NEGATIVE**   06/07/2021 Initial Diagnosis   Malignant neoplasm of upper-inner quadrant of left breast in female, estrogen receptor positive (HCC)   06/27/2021 Cancer Staging   Staging form: Breast, AJCC 8th Edition - Pathologic stage from 06/27/2021: Stage Unknown (pT1b, pNX, cM0, G1, ER+, PR+, HER2-) - Signed by Lanny Callander, MD on 09/10/2021 Stage prefix: Initial diagnosis Multigene prognostic tests performed: None Histologic grading system: 3 grade system   06/27/2021 Definitive Surgery   FINAL MICROSCOPIC DIAGNOSIS:   A. BREAST, LEFT, LUMPECTOMY:  -  Invasive lobular carcinoma, Nottingham grade 1 of 3, 0.9 cm  -  Margins uninvolved by carcinoma (<0.1 cm; posterior margin)  -  Previous biopsy site changes present  -  See oncology table and comment below  Comment(s): Cytokeratin AE1/3 highlights the presence of invasive carcinoma in proximity to the inked posterior margin.    12/14/2021 Survivorship   SCP delivered by Lacie Burton   06/19/2023 Mammogram   3D bilateral diagnostic mammogram  IMPRESSION: No mammographic evidence of malignancy in the bilateral breasts.   RECOMMENDATION: As the patient is now over 2 years out from her lumpectomy, she may return to annual screening mammography in 1 year. Given her history of breast cancer, she remains eligible for annual diagnostic mammography, if preferred.     BI-RADS CATEGORY  2: Benign.       REVIEW OF SYSTEMS:   Constitutional: Denies fevers, chills or abnormal weight loss Eyes: Denies blurriness of vision Ears, nose, mouth, throat, and face: Denies mucositis or sore throat Respiratory: Denies cough, dyspnea or wheezes Cardiovascular: Denies palpitation, chest discomfort or lower extremity swelling Gastrointestinal:  Denies nausea, heartburn or change in bowel habits Skin: poison oak rash,  most severe on left forearm, but also present on right forearm, chest, and neck as well.  Lymphatics: Denies new lymphadenopathy or easy bruising Neurological:Denies numbness, tingling or new weaknesses Behavioral/Psych: Mood is stable, no new changes  All other systems were reviewed with the patient and are negative.   VITALS:   Today's Vitals   04/02/24 0942 04/02/24 1022  BP: 138/66   Pulse: (!) 55   Resp: 17   Temp: 97.6 F (36.4 C)   SpO2: 96%   Weight: 107 lb 3.2 oz (48.6 kg)   PainSc:  0-No pain   Body mass index is 18.4 kg/m.   Wt Readings from Last 3 Encounters:  04/02/24 107 lb 3.2 oz (48.6 kg)  10/01/23 107 lb 11.2 oz (48.9 kg)  04/04/23 106  lb 3.2 oz (48.2 kg)    Body mass index is 18.4 kg/m.  Performance status (ECOG): 1 - Symptomatic but completely ambulatory  PHYSICAL EXAM:   GENERAL:alert, no distress and comfortable SKIN: skin color, texture, turgor are normal, no rashes or significant lesions EYES: normal, Conjunctiva are pink and non-injected, sclera clear OROPHARYNX:no exudate, no erythema and lips, buccal mucosa, and tongue normal  NECK: supple, thyroid  normal size, non-tender, without nodularity LYMPH:  no palpable lymphadenopathy in the cervical, axillary or inguinal LUNGS: clear to auscultation and percussion with normal breathing effort HEART: regular rate & rhythm and no murmurs and no lower extremity edema ABDOMEN:abdomen soft, non-tender and normal bowel sounds Musculoskeletal:no cyanosis of digits and no clubbing  NEURO: alert & oriented x 3 with fluent speech, no focal motor/sensory deficits BREAST: There is well-healed lumpectomy scar in the left breast.  There are no palpable masses or lumps.  There is no nipple inversion or nipple discharge.  There is no axillary lymphadenopathy on the left.  There are no palpable masses or lumps in the right breast.  There is no nipple inversion or nipple discharge.  There is no axillary lymphadenopathy  on the right.  LABORATORY DATA:  I have reviewed the data as listed    Component Value Date/Time   NA 138 04/02/2024 0924   K 4.6 04/02/2024 0924   CL 102 04/02/2024 0924   CO2 31 04/02/2024 0924   GLUCOSE 97 04/02/2024 0924   BUN 24 (H) 04/02/2024 0924   CREATININE 0.66 04/02/2024 0924   CALCIUM 10.0 04/02/2024 0924   PROT 8.5 (H) 04/02/2024 0924   ALBUMIN 4.3 04/02/2024 0924   AST 20 04/02/2024 0924   ALT 24 04/02/2024 0924   ALKPHOS 30 (L) 04/02/2024 0924   BILITOT 0.8 04/02/2024 0924   GFRNONAA >60 04/02/2024 0924     Lab Results  Component Value Date   WBC 14.3 (H) 04/02/2024   NEUTROABS 11.2 (H) 04/02/2024   HGB 12.7 04/02/2024   HCT 38.0 04/02/2024   MCV 92.2 04/02/2024   PLT 299 04/02/2024

## 2024-04-02 ENCOUNTER — Inpatient Hospital Stay: Admitting: Nurse Practitioner

## 2024-04-02 ENCOUNTER — Telehealth: Payer: Self-pay | Admitting: Nurse Practitioner

## 2024-04-02 ENCOUNTER — Inpatient Hospital Stay: Attending: Nurse Practitioner

## 2024-04-02 VITALS — BP 138/66 | HR 55 | Temp 97.6°F | Resp 17 | Wt 107.2 lb

## 2024-04-02 DIAGNOSIS — Z170 Estrogen receptor positive status [ER+]: Secondary | ICD-10-CM | POA: Insufficient documentation

## 2024-04-02 DIAGNOSIS — E2839 Other primary ovarian failure: Secondary | ICD-10-CM

## 2024-04-02 DIAGNOSIS — Z923 Personal history of irradiation: Secondary | ICD-10-CM | POA: Insufficient documentation

## 2024-04-02 DIAGNOSIS — M8588 Other specified disorders of bone density and structure, other site: Secondary | ICD-10-CM | POA: Diagnosis not present

## 2024-04-02 DIAGNOSIS — L237 Allergic contact dermatitis due to plants, except food: Secondary | ICD-10-CM | POA: Diagnosis not present

## 2024-04-02 DIAGNOSIS — Z79899 Other long term (current) drug therapy: Secondary | ICD-10-CM | POA: Insufficient documentation

## 2024-04-02 DIAGNOSIS — C50212 Malignant neoplasm of upper-inner quadrant of left female breast: Secondary | ICD-10-CM

## 2024-04-02 DIAGNOSIS — Z79811 Long term (current) use of aromatase inhibitors: Secondary | ICD-10-CM | POA: Insufficient documentation

## 2024-04-02 DIAGNOSIS — M8580 Other specified disorders of bone density and structure, unspecified site: Secondary | ICD-10-CM | POA: Insufficient documentation

## 2024-04-02 LAB — CBC WITH DIFFERENTIAL (CANCER CENTER ONLY)
Abs Immature Granulocytes: 0.06 K/uL (ref 0.00–0.07)
Basophils Absolute: 0 K/uL (ref 0.0–0.1)
Basophils Relative: 0 %
Eosinophils Absolute: 0 K/uL (ref 0.0–0.5)
Eosinophils Relative: 0 %
HCT: 38 % (ref 36.0–46.0)
Hemoglobin: 12.7 g/dL (ref 12.0–15.0)
Immature Granulocytes: 0 %
Lymphocytes Relative: 15 %
Lymphs Abs: 2.1 K/uL (ref 0.7–4.0)
MCH: 30.8 pg (ref 26.0–34.0)
MCHC: 33.4 g/dL (ref 30.0–36.0)
MCV: 92.2 fL (ref 80.0–100.0)
Monocytes Absolute: 0.9 K/uL (ref 0.1–1.0)
Monocytes Relative: 7 %
Neutro Abs: 11.2 K/uL — ABNORMAL HIGH (ref 1.7–7.7)
Neutrophils Relative %: 78 %
Platelet Count: 299 K/uL (ref 150–400)
RBC: 4.12 MIL/uL (ref 3.87–5.11)
RDW: 13.4 % (ref 11.5–15.5)
WBC Count: 14.3 K/uL — ABNORMAL HIGH (ref 4.0–10.5)
nRBC: 0 % (ref 0.0–0.2)

## 2024-04-02 LAB — CMP (CANCER CENTER ONLY)
ALT: 24 U/L (ref 0–44)
AST: 20 U/L (ref 15–41)
Albumin: 4.3 g/dL (ref 3.5–5.0)
Alkaline Phosphatase: 30 U/L — ABNORMAL LOW (ref 38–126)
Anion gap: 5 (ref 5–15)
BUN: 24 mg/dL — ABNORMAL HIGH (ref 8–23)
CO2: 31 mmol/L (ref 22–32)
Calcium: 10 mg/dL (ref 8.9–10.3)
Chloride: 102 mmol/L (ref 98–111)
Creatinine: 0.66 mg/dL (ref 0.44–1.00)
GFR, Estimated: >60 mL/min (ref >60)
Glucose, Bld: 97 mg/dL (ref 70–99)
Potassium: 4.6 mmol/L (ref 3.5–5.1)
Sodium: 138 mmol/L (ref 135–145)
Total Bilirubin: 0.8 mg/dL (ref 0.0–1.2)
Total Protein: 8.5 g/dL — ABNORMAL HIGH (ref 6.5–8.1)

## 2024-04-02 NOTE — Telephone Encounter (Signed)
 Scheduled the patients appointments for March. Spoke with the patient and she is aware of the day and times.

## 2024-04-04 ENCOUNTER — Other Ambulatory Visit: Payer: Self-pay | Admitting: Nurse Practitioner

## 2024-04-06 ENCOUNTER — Encounter: Payer: Self-pay | Admitting: Nurse Practitioner

## 2024-05-01 ENCOUNTER — Other Ambulatory Visit: Payer: Self-pay | Admitting: Nurse Practitioner

## 2024-06-19 ENCOUNTER — Ambulatory Visit
Admission: RE | Admit: 2024-06-19 | Discharge: 2024-06-19 | Disposition: A | Discharge status: Home / Self Care | Source: Ambulatory Visit | Attending: Nurse Practitioner | Admitting: Nurse Practitioner

## 2024-06-19 DIAGNOSIS — Z170 Estrogen receptor positive status [ER+]: Secondary | ICD-10-CM

## 2024-06-19 DIAGNOSIS — R928 Other abnormal and inconclusive findings on diagnostic imaging of breast: Secondary | ICD-10-CM | POA: Diagnosis not present

## 2024-10-01 ENCOUNTER — Inpatient Hospital Stay

## 2024-10-01 ENCOUNTER — Inpatient Hospital Stay: Admitting: Nurse Practitioner
